# Patient Record
Sex: Female | Born: 1960 | Race: White | Hispanic: No | Marital: Single | Smoking: Never smoker
Health system: Southern US, Community
[De-identification: ages and names within clinical notes are randomized; demographics above are authoritative.]

## PROBLEM LIST (undated history)

## (undated) DIAGNOSIS — M1990 Unspecified osteoarthritis, unspecified site: Secondary | ICD-10-CM

## (undated) DIAGNOSIS — G4730 Sleep apnea, unspecified: Secondary | ICD-10-CM

## (undated) DIAGNOSIS — I10 Essential (primary) hypertension: Secondary | ICD-10-CM

## (undated) DIAGNOSIS — J449 Chronic obstructive pulmonary disease, unspecified: Secondary | ICD-10-CM

## (undated) DIAGNOSIS — I2510 Atherosclerotic heart disease of native coronary artery without angina pectoris: Secondary | ICD-10-CM

## (undated) DIAGNOSIS — I509 Heart failure, unspecified: Secondary | ICD-10-CM

## (undated) HISTORY — PX: FRACTURE SURGERY: SHX138

## (undated) HISTORY — PX: AORTIC VALVE REPLACEMENT (AVR)/CORONARY ARTERY BYPASS GRAFTING (CABG): SHX5725

## (undated) HISTORY — PX: ABDOMINAL SURGERY: SHX537

## (undated) HISTORY — PX: CORONARY ARTERY BYPASS GRAFT: SHX141

---

## 2018-04-03 ENCOUNTER — Other Ambulatory Visit: Payer: Self-pay

## 2018-04-03 ENCOUNTER — Emergency Department (HOSPITAL_COMMUNITY): Payer: Medicaid Other

## 2018-04-03 ENCOUNTER — Inpatient Hospital Stay (HOSPITAL_COMMUNITY)
Admission: EM | Admit: 2018-04-03 | Discharge: 2018-04-15 | DRG: 308 | Discharge status: Against Medical Advice | Payer: Medicaid Other | Attending: Internal Medicine | Admitting: Internal Medicine

## 2018-04-03 ENCOUNTER — Encounter (HOSPITAL_COMMUNITY): Payer: Self-pay

## 2018-04-03 DIAGNOSIS — Y901 Blood alcohol level of 20-39 mg/100 ml: Secondary | ICD-10-CM | POA: Diagnosis present

## 2018-04-03 DIAGNOSIS — K921 Melena: Secondary | ICD-10-CM | POA: Diagnosis present

## 2018-04-03 DIAGNOSIS — E440 Moderate protein-calorie malnutrition: Secondary | ICD-10-CM | POA: Diagnosis present

## 2018-04-03 DIAGNOSIS — I5043 Acute on chronic combined systolic (congestive) and diastolic (congestive) heart failure: Secondary | ICD-10-CM | POA: Diagnosis present

## 2018-04-03 DIAGNOSIS — R52 Pain, unspecified: Secondary | ICD-10-CM

## 2018-04-03 DIAGNOSIS — D500 Iron deficiency anemia secondary to blood loss (chronic): Secondary | ICD-10-CM | POA: Diagnosis present

## 2018-04-03 DIAGNOSIS — R112 Nausea with vomiting, unspecified: Secondary | ICD-10-CM

## 2018-04-03 DIAGNOSIS — I4891 Unspecified atrial fibrillation: Secondary | ICD-10-CM | POA: Diagnosis present

## 2018-04-03 DIAGNOSIS — R195 Other fecal abnormalities: Secondary | ICD-10-CM | POA: Diagnosis present

## 2018-04-03 DIAGNOSIS — Z952 Presence of prosthetic heart valve: Secondary | ICD-10-CM

## 2018-04-03 DIAGNOSIS — I272 Pulmonary hypertension, unspecified: Secondary | ICD-10-CM | POA: Diagnosis present

## 2018-04-03 DIAGNOSIS — Z9119 Patient's noncompliance with other medical treatment and regimen: Secondary | ICD-10-CM

## 2018-04-03 DIAGNOSIS — I110 Hypertensive heart disease with heart failure: Secondary | ICD-10-CM | POA: Diagnosis present

## 2018-04-03 DIAGNOSIS — Z9114 Patient's other noncompliance with medication regimen: Secondary | ICD-10-CM

## 2018-04-03 DIAGNOSIS — E8809 Other disorders of plasma-protein metabolism, not elsewhere classified: Secondary | ICD-10-CM | POA: Diagnosis not present

## 2018-04-03 DIAGNOSIS — M25571 Pain in right ankle and joints of right foot: Secondary | ICD-10-CM | POA: Diagnosis present

## 2018-04-03 DIAGNOSIS — F10220 Alcohol dependence with intoxication, uncomplicated: Secondary | ICD-10-CM

## 2018-04-03 DIAGNOSIS — I5082 Biventricular heart failure: Secondary | ICD-10-CM | POA: Diagnosis present

## 2018-04-03 DIAGNOSIS — J9811 Atelectasis: Secondary | ICD-10-CM | POA: Diagnosis present

## 2018-04-03 DIAGNOSIS — F1020 Alcohol dependence, uncomplicated: Secondary | ICD-10-CM | POA: Diagnosis not present

## 2018-04-03 DIAGNOSIS — R296 Repeated falls: Secondary | ICD-10-CM

## 2018-04-03 DIAGNOSIS — Z79899 Other long term (current) drug therapy: Secondary | ICD-10-CM

## 2018-04-03 DIAGNOSIS — E162 Hypoglycemia, unspecified: Secondary | ICD-10-CM | POA: Diagnosis not present

## 2018-04-03 DIAGNOSIS — F419 Anxiety disorder, unspecified: Secondary | ICD-10-CM | POA: Diagnosis present

## 2018-04-03 DIAGNOSIS — Z951 Presence of aortocoronary bypass graft: Secondary | ICD-10-CM

## 2018-04-03 DIAGNOSIS — E876 Hypokalemia: Secondary | ICD-10-CM | POA: Diagnosis not present

## 2018-04-03 DIAGNOSIS — Z7901 Long term (current) use of anticoagulants: Secondary | ICD-10-CM

## 2018-04-03 DIAGNOSIS — K449 Diaphragmatic hernia without obstruction or gangrene: Secondary | ICD-10-CM | POA: Diagnosis present

## 2018-04-03 DIAGNOSIS — E875 Hyperkalemia: Secondary | ICD-10-CM | POA: Diagnosis present

## 2018-04-03 DIAGNOSIS — K760 Fatty (change of) liver, not elsewhere classified: Secondary | ICD-10-CM | POA: Diagnosis present

## 2018-04-03 DIAGNOSIS — I482 Chronic atrial fibrillation: Principal | ICD-10-CM | POA: Diagnosis present

## 2018-04-03 DIAGNOSIS — R1033 Periumbilical pain: Secondary | ICD-10-CM | POA: Insufficient documentation

## 2018-04-03 DIAGNOSIS — E0781 Sick-euthyroid syndrome: Secondary | ICD-10-CM | POA: Diagnosis present

## 2018-04-03 DIAGNOSIS — Z9981 Dependence on supplemental oxygen: Secondary | ICD-10-CM

## 2018-04-03 DIAGNOSIS — Z7141 Alcohol abuse counseling and surveillance of alcoholic: Secondary | ICD-10-CM

## 2018-04-03 DIAGNOSIS — Y636 Underdosing and nonadministration of necessary drug, medicament or biological substance: Secondary | ICD-10-CM | POA: Diagnosis present

## 2018-04-03 DIAGNOSIS — R945 Abnormal results of liver function studies: Secondary | ICD-10-CM | POA: Diagnosis present

## 2018-04-03 DIAGNOSIS — J449 Chronic obstructive pulmonary disease, unspecified: Secondary | ICD-10-CM | POA: Diagnosis present

## 2018-04-03 DIAGNOSIS — Z9181 History of falling: Secondary | ICD-10-CM

## 2018-04-03 DIAGNOSIS — F418 Other specified anxiety disorders: Secondary | ICD-10-CM | POA: Diagnosis present

## 2018-04-03 DIAGNOSIS — I2510 Atherosclerotic heart disease of native coronary artery without angina pectoris: Secondary | ICD-10-CM | POA: Diagnosis present

## 2018-04-03 HISTORY — DX: Essential (primary) hypertension: I10

## 2018-04-03 HISTORY — DX: Heart failure, unspecified: I50.9

## 2018-04-03 HISTORY — DX: Unspecified osteoarthritis, unspecified site: M19.90

## 2018-04-03 HISTORY — DX: Chronic obstructive pulmonary disease, unspecified: J44.9

## 2018-04-03 HISTORY — DX: Sleep apnea, unspecified: G47.30

## 2018-04-03 HISTORY — DX: Atherosclerotic heart disease of native coronary artery without angina pectoris: I25.10

## 2018-04-03 LAB — URINALYSIS, ROUTINE W REFLEX MICROSCOPIC
BILIRUBIN URINE: NEGATIVE
Glucose, UA: NEGATIVE mg/dL
HGB URINE DIPSTICK: NEGATIVE
Ketones, ur: 20 mg/dL — AB
Leukocytes, UA: NEGATIVE
Nitrite: NEGATIVE
PROTEIN: NEGATIVE mg/dL
Specific Gravity, Urine: 1.033 — ABNORMAL HIGH (ref 1.005–1.030)
pH: 6 (ref 5.0–8.0)

## 2018-04-03 LAB — POC OCCULT BLOOD, ED: FECAL OCCULT BLD: POSITIVE — AB

## 2018-04-03 LAB — I-STAT VENOUS BLOOD GAS, ED
Bicarbonate: 26.2 mmol/L (ref 20.0–28.0)
O2 Saturation: 77 %
PCO2 VEN: 47.7 mmHg (ref 44.0–60.0)
PH VEN: 7.348 (ref 7.250–7.430)
PO2 VEN: 44 mmHg (ref 32.0–45.0)
TCO2: 28 mmol/L (ref 22–32)

## 2018-04-03 LAB — CBC WITH DIFFERENTIAL/PLATELET
ABS IMMATURE GRANULOCYTES: 0.1 10*3/uL (ref 0.0–0.1)
BASOS PCT: 1 %
Basophils Absolute: 0 10*3/uL (ref 0.0–0.1)
EOS PCT: 1 %
Eosinophils Absolute: 0 10*3/uL (ref 0.0–0.7)
HCT: 37.8 % (ref 36.0–46.0)
Hemoglobin: 12.3 g/dL (ref 12.0–15.0)
IMMATURE GRANULOCYTES: 1 %
LYMPHS ABS: 1.4 10*3/uL (ref 0.7–4.0)
LYMPHS PCT: 26 %
MCH: 31.2 pg (ref 26.0–34.0)
MCHC: 32.5 g/dL (ref 30.0–36.0)
MCV: 95.9 fL (ref 78.0–100.0)
MONO ABS: 0.6 10*3/uL (ref 0.1–1.0)
Monocytes Relative: 12 %
Neutro Abs: 3.3 10*3/uL (ref 1.7–7.7)
Neutrophils Relative %: 61 %
PLATELETS: 352 10*3/uL (ref 150–400)
RBC: 3.94 MIL/uL (ref 3.87–5.11)
RDW: 15.8 % — ABNORMAL HIGH (ref 11.5–15.5)
WBC: 5.5 10*3/uL (ref 4.0–10.5)

## 2018-04-03 LAB — COMPREHENSIVE METABOLIC PANEL
ALT: 52 U/L — ABNORMAL HIGH (ref 0–44)
AST: 167 U/L — AB (ref 15–41)
Albumin: 2.6 g/dL — ABNORMAL LOW (ref 3.5–5.0)
Alkaline Phosphatase: 143 U/L — ABNORMAL HIGH (ref 38–126)
Anion gap: 18 — ABNORMAL HIGH (ref 5–15)
BUN: 5 mg/dL — AB (ref 6–20)
CO2: 23 mmol/L (ref 22–32)
CREATININE: 0.72 mg/dL (ref 0.44–1.00)
Calcium: 7.7 mg/dL — ABNORMAL LOW (ref 8.9–10.3)
Chloride: 97 mmol/L — ABNORMAL LOW (ref 98–111)
GFR calc Af Amer: >60 mL/min (ref >60)
GFR calc non Af Amer: >60 mL/min (ref >60)
Glucose, Bld: 52 mg/dL — ABNORMAL LOW (ref 70–99)
Potassium: 3.6 mmol/L (ref 3.5–5.1)
SODIUM: 138 mmol/L (ref 135–145)
Total Bilirubin: 1.6 mg/dL — ABNORMAL HIGH (ref 0.3–1.2)
Total Protein: 5.3 g/dL — ABNORMAL LOW (ref 6.5–8.1)

## 2018-04-03 LAB — I-STAT TROPONIN, ED: Troponin i, poc: 0.02 ng/mL (ref 0.00–0.08)

## 2018-04-03 LAB — CBG MONITORING, ED
GLUCOSE-CAPILLARY: 45 mg/dL — AB (ref 70–99)
GLUCOSE-CAPILLARY: 105 mg/dL — AB (ref 70–99)

## 2018-04-03 LAB — RAPID URINE DRUG SCREEN, HOSP PERFORMED
AMPHETAMINES: NOT DETECTED
Barbiturates: NOT DETECTED
Benzodiazepines: NOT DETECTED
Cocaine: NOT DETECTED
OPIATES: POSITIVE — AB
Tetrahydrocannabinol: NOT DETECTED

## 2018-04-03 LAB — LIPASE, BLOOD: Lipase: 27 U/L (ref 11–51)

## 2018-04-03 LAB — I-STAT CG4 LACTIC ACID, ED
LACTIC ACID, VENOUS: 2.46 mmol/L — AB (ref 0.5–1.9)
LACTIC ACID, VENOUS: 1.63 mmol/L (ref 0.5–1.9)

## 2018-04-03 LAB — BRAIN NATRIURETIC PEPTIDE: B NATRIURETIC PEPTIDE 5: 466.4 pg/mL — AB (ref 0.0–100.0)

## 2018-04-03 LAB — ETHANOL: ALCOHOL ETHYL (B): 38 mg/dL — AB (ref <10)

## 2018-04-03 LAB — TROPONIN I: Troponin I: <0.03 ng/mL (ref <0.03)

## 2018-04-03 MED ORDER — LORAZEPAM 1 MG PO TABS
0.0000 mg | ORAL_TABLET | Freq: Four times a day (QID) | ORAL | Status: DC
  Administered 2018-04-03: 1 mg via ORAL
  Filled 2018-04-03: qty 1

## 2018-04-03 MED ORDER — LORAZEPAM 2 MG/ML IJ SOLN: 0.0000 mg | Freq: Four times a day (QID) | INTRAMUSCULAR | Status: DC

## 2018-04-03 MED ORDER — SODIUM CHLORIDE 0.9% FLUSH
3.0000 mL | Freq: Two times a day (BID) | INTRAVENOUS | Status: DC
  Administered 2018-04-04 (×2): 3 mL via INTRAVENOUS

## 2018-04-03 MED ORDER — THIAMINE HCL 100 MG/ML IJ SOLN
100.0000 mg | Freq: Once | INTRAMUSCULAR | Status: AC
  Administered 2018-04-03: 100 mg via INTRAVENOUS
  Filled 2018-04-03: qty 2

## 2018-04-03 MED ORDER — TRAZODONE HCL 100 MG PO TABS
100.0000 mg | ORAL_TABLET | Freq: Every day | ORAL | Status: DC
  Administered 2018-04-04 (×2): 100 mg via ORAL
  Filled 2018-04-03 (×2): qty 1

## 2018-04-03 MED ORDER — DEXTROSE 50 % IV SOLN
0.5000 | Freq: Once | INTRAVENOUS | Status: DC
  Administered 2018-04-03: 25 mL via INTRAVENOUS

## 2018-04-03 MED ORDER — VANCOMYCIN HCL 10 G IV SOLR
2000.0000 mg | Freq: Once | INTRAVENOUS | Status: AC
  Administered 2018-04-03: 2000 mg via INTRAVENOUS
  Filled 2018-04-03: qty 2000

## 2018-04-03 MED ORDER — NAPHAZOLINE-GLYCERIN 0.012-0.2 % OP SOLN: 2.0000 [drp] | Freq: Four times a day (QID) | OPHTHALMIC | Status: DC | PRN

## 2018-04-03 MED ORDER — ACETAMINOPHEN 650 MG RE SUPP: 650.0000 mg | Freq: Four times a day (QID) | RECTAL | Status: DC | PRN

## 2018-04-03 MED ORDER — SODIUM CHLORIDE 0.9% FLUSH
3.0000 mL | Freq: Two times a day (BID) | INTRAVENOUS | Status: DC
  Administered 2018-04-04 – 2018-04-14 (×17): 3 mL via INTRAVENOUS

## 2018-04-03 MED ORDER — DEXTROSE 50 % IV SOLN
25.0000 mL | Freq: Once | INTRAVENOUS | Status: AC
  Administered 2018-04-03: 25 mL via INTRAVENOUS

## 2018-04-03 MED ORDER — LORAZEPAM 2 MG/ML IJ SOLN: 0.0000 mg | Freq: Two times a day (BID) | INTRAMUSCULAR | Status: DC

## 2018-04-03 MED ORDER — SODIUM CHLORIDE 0.9 % IV SOLN
250.0000 mL | INTRAVENOUS | Status: DC | PRN
  Administered 2018-04-04: 250 mL via INTRAVENOUS

## 2018-04-03 MED ORDER — DULOXETINE HCL 60 MG PO CPEP
60.0000 mg | ORAL_CAPSULE | Freq: Every day | ORAL | Status: DC
  Administered 2018-04-04 – 2018-04-15 (×12): 60 mg via ORAL
  Filled 2018-04-03 (×12): qty 1

## 2018-04-03 MED ORDER — DILTIAZEM HCL-DEXTROSE 100-5 MG/100ML-% IV SOLN (PREMIX)
5.0000 mg/h | INTRAVENOUS | Status: DC
  Administered 2018-04-03 – 2018-04-04 (×2): 5 mg/h via INTRAVENOUS
  Administered 2018-04-05: 7.5 mg/h via INTRAVENOUS
  Filled 2018-04-03 (×4): qty 100

## 2018-04-03 MED ORDER — VITAMIN B-1 100 MG PO TABS
250.0000 mg | ORAL_TABLET | Freq: Every day | ORAL | Status: DC
  Administered 2018-04-04 – 2018-04-15 (×12): 250 mg via ORAL
  Filled 2018-04-03 (×13): qty 3

## 2018-04-03 MED ORDER — LORAZEPAM 2 MG/ML IJ SOLN: 1.0000 mg | Freq: Four times a day (QID) | INTRAMUSCULAR | Status: AC | PRN

## 2018-04-03 MED ORDER — HYDROCODONE-ACETAMINOPHEN 5-325 MG PO TABS
1.0000 | ORAL_TABLET | ORAL | Status: DC | PRN
  Administered 2018-04-04: 2 via ORAL
  Filled 2018-04-03: qty 2

## 2018-04-03 MED ORDER — METOPROLOL TARTRATE 5 MG/5ML IV SOLN
5.0000 mg | Freq: Once | INTRAVENOUS | Status: DC
  Administered 2018-04-03: 5 mg via INTRAVENOUS
  Filled 2018-04-03: qty 5

## 2018-04-03 MED ORDER — IOPAMIDOL (ISOVUE-300) INJECTION 61%
INTRAVENOUS | Status: AC
  Administered 2018-04-03: 100 mL
  Filled 2018-04-03: qty 100

## 2018-04-03 MED ORDER — PANTOPRAZOLE SODIUM 40 MG IV SOLR
40.0000 mg | Freq: Two times a day (BID) | INTRAVENOUS | Status: DC
  Administered 2018-04-03 – 2018-04-04 (×2): 40 mg via INTRAVENOUS
  Filled 2018-04-03 (×3): qty 40

## 2018-04-03 MED ORDER — VANCOMYCIN HCL 10 G IV SOLR
1250.0000 mg | Freq: Two times a day (BID) | INTRAVENOUS | Status: DC
  Filled 2018-04-03: qty 1250

## 2018-04-03 MED ORDER — DEXTROSE 50 % IV SOLN
0.5000 | Freq: Once | INTRAVENOUS | Status: AC
  Administered 2018-04-03: 25 mL via INTRAVENOUS
  Filled 2018-04-03: qty 50

## 2018-04-03 MED ORDER — LORAZEPAM 1 MG PO TABS
0.0000 mg | ORAL_TABLET | Freq: Four times a day (QID) | ORAL | Status: AC
  Administered 2018-04-04: 1 mg via ORAL
  Administered 2018-04-04 – 2018-04-05 (×3): 2 mg via ORAL
  Administered 2018-04-05: 1 mg via ORAL
  Filled 2018-04-03: qty 2
  Filled 2018-04-03 (×4): qty 1
  Filled 2018-04-03 (×2): qty 2

## 2018-04-03 MED ORDER — METOPROLOL SUCCINATE ER 100 MG PO TB24: 100.0000 mg | ORAL_TABLET | Freq: Every day | ORAL | Status: DC

## 2018-04-03 MED ORDER — ROFLUMILAST 500 MCG PO TABS
500.0000 ug | ORAL_TABLET | Freq: Every day | ORAL | Status: DC
  Administered 2018-04-04 – 2018-04-15 (×12): 500 ug via ORAL
  Filled 2018-04-03 (×12): qty 1

## 2018-04-03 MED ORDER — PIPERACILLIN-TAZOBACTAM 3.375 G IVPB: 3.3750 g | Freq: Three times a day (TID) | INTRAVENOUS | Status: DC

## 2018-04-03 MED ORDER — SENNOSIDES-DOCUSATE SODIUM 8.6-50 MG PO TABS: 1.0000 | ORAL_TABLET | Freq: Every evening | ORAL | Status: DC | PRN

## 2018-04-03 MED ORDER — ALBUTEROL SULFATE (2.5 MG/3ML) 0.083% IN NEBU: 2.5000 mg | INHALATION_SOLUTION | Freq: Four times a day (QID) | RESPIRATORY_TRACT | Status: DC | PRN

## 2018-04-03 MED ORDER — ONDANSETRON HCL 4 MG/2ML IJ SOLN: 4.0000 mg | Freq: Four times a day (QID) | INTRAMUSCULAR | Status: DC | PRN

## 2018-04-03 MED ORDER — LORAZEPAM 1 MG PO TABS
0.0000 mg | ORAL_TABLET | Freq: Two times a day (BID) | ORAL | Status: AC
  Administered 2018-04-06: 1 mg via ORAL
  Administered 2018-04-06: 2 mg via ORAL
  Administered 2018-04-07: 1 mg via ORAL
  Filled 2018-04-03 (×2): qty 1
  Filled 2018-04-03: qty 2

## 2018-04-03 MED ORDER — MAGNESIUM OXIDE 400 (241.3 MG) MG PO TABS
400.0000 mg | ORAL_TABLET | Freq: Every day | ORAL | Status: DC
  Administered 2018-04-04 – 2018-04-09 (×6): 400 mg via ORAL
  Filled 2018-04-03 (×6): qty 1

## 2018-04-03 MED ORDER — PIPERACILLIN-TAZOBACTAM 3.375 G IVPB 30 MIN
3.3750 g | Freq: Once | INTRAVENOUS | Status: AC
  Administered 2018-04-03: 3.375 g via INTRAVENOUS
  Filled 2018-04-03: qty 50

## 2018-04-03 MED ORDER — DEXTROSE 50 % IV SOLN
INTRAVENOUS | Status: AC
  Filled 2018-04-03: qty 50

## 2018-04-03 MED ORDER — DEXTROSE 10 % IV SOLN
INTRAVENOUS | Status: DC
  Administered 2018-04-04: 02:00:00 via INTRAVENOUS

## 2018-04-03 MED ORDER — FOLIC ACID 1 MG PO TABS
1.0000 mg | ORAL_TABLET | Freq: Once | ORAL | Status: AC
  Administered 2018-04-03: 1 mg via ORAL
  Filled 2018-04-03: qty 1

## 2018-04-03 MED ORDER — LORAZEPAM 1 MG PO TABS: 1.0000 mg | ORAL_TABLET | Freq: Four times a day (QID) | ORAL | Status: AC | PRN

## 2018-04-03 MED ORDER — LORAZEPAM 2 MG/ML IJ SOLN
1.0000 mg | Freq: Once | INTRAMUSCULAR | Status: AC
  Administered 2018-04-03: 1 mg via INTRAVENOUS
  Filled 2018-04-03: qty 1

## 2018-04-03 MED ORDER — GABAPENTIN 300 MG PO CAPS
600.0000 mg | ORAL_CAPSULE | Freq: Three times a day (TID) | ORAL | Status: DC
  Administered 2018-04-04 – 2018-04-15 (×35): 600 mg via ORAL
  Filled 2018-04-03 (×35): qty 2

## 2018-04-03 MED ORDER — LORAZEPAM 1 MG PO TABS: 0.0000 mg | ORAL_TABLET | Freq: Two times a day (BID) | ORAL | Status: DC

## 2018-04-03 MED ORDER — ACETAMINOPHEN 325 MG PO TABS
650.0000 mg | ORAL_TABLET | Freq: Four times a day (QID) | ORAL | Status: DC | PRN
  Administered 2018-04-05 – 2018-04-13 (×7): 650 mg via ORAL
  Filled 2018-04-03 (×8): qty 2

## 2018-04-03 MED ORDER — VITAMIN B-12 1000 MCG PO TABS
500.0000 ug | ORAL_TABLET | Freq: Every day | ORAL | Status: DC
  Administered 2018-04-04 – 2018-04-15 (×12): 500 ug via ORAL
  Filled 2018-04-03 (×12): qty 1

## 2018-04-03 MED ORDER — SODIUM CHLORIDE 0.9% FLUSH: 3.0000 mL | INTRAVENOUS | Status: DC | PRN

## 2018-04-03 MED ORDER — ONDANSETRON HCL 4 MG PO TABS: 4.0000 mg | ORAL_TABLET | Freq: Four times a day (QID) | ORAL | Status: DC | PRN

## 2018-04-03 MED ORDER — METOPROLOL TARTRATE 5 MG/5ML IV SOLN
5.0000 mg | Freq: Once | INTRAVENOUS | Status: AC
  Administered 2018-04-03: 5 mg via INTRAVENOUS
  Filled 2018-04-03: qty 5

## 2018-04-03 MED ORDER — ONDANSETRON HCL 4 MG/2ML IJ SOLN
4.0000 mg | Freq: Once | INTRAMUSCULAR | Status: AC
  Administered 2018-04-03: 4 mg via INTRAVENOUS
  Filled 2018-04-03: qty 2

## 2018-04-03 MED ORDER — MORPHINE SULFATE (PF) 4 MG/ML IV SOLN
4.0000 mg | Freq: Once | INTRAVENOUS | Status: AC
  Administered 2018-04-03: 4 mg via INTRAVENOUS
  Filled 2018-04-03: qty 1

## 2018-04-03 MED ORDER — FOLIC ACID 1 MG PO TABS
1.0000 mg | ORAL_TABLET | Freq: Every day | ORAL | Status: DC
  Administered 2018-04-04 – 2018-04-15 (×12): 1 mg via ORAL
  Filled 2018-04-03 (×12): qty 1

## 2018-04-03 MED ORDER — BUSPIRONE HCL 5 MG PO TABS
30.0000 mg | ORAL_TABLET | Freq: Two times a day (BID) | ORAL | Status: DC
  Administered 2018-04-04 – 2018-04-15 (×24): 30 mg via ORAL
  Filled 2018-04-03 (×26): qty 6

## 2018-04-03 NOTE — ED Provider Notes (Addendum)
[facility] EMERGENCY DEPARTMENT Provider Note   CSN: [id number] Arrival date & time: 04/03/18  1518     History   Chief Complaint Chief Complaint  Patient presents with  . Fatigue  . Atrial Fibrillation    HPI Laurie Lynch is a 57 y.o. female with a history of alcohol use disorder, Afib on Eliquis, COPD, CHF, CAD, periumbilical hernia s/p repair,and HTN who presents to the emergency department with a chief complaint of abdominal pain.  The patient endorses constant, worsening periumbilical pain and abdominal bloating for one week.  She reports associated nausea and black tarry stools for the last few days.  No history of similar.  She also reports bilateral lower extremity edema that has been gradually worsening over the last few days.  She also states that she is having all over pain and it feels as if her entire body is on fire.  She states that when the paramedics was attempting to place the IV in her left upper arm during transport that she was screaming in pain because she could barely stand any pressure to her skin. EMS reports she was given 5 mg of metoprolol and route as her EKG showed that she was in atrial fibrillation. She was also given 50 mcg of fentanyl for pain control.  She also reports that she has been feeling very fatigued for the last 3 days.  She has not had any of her home medications for the last 2 days because she was not feeling well.   She was most recently admitted at [facility] from 8/20-8/28 after she presented to the ED with N/V, pain all over, fatigue, weakness, and poor urine output. Her symptoms were thought to be secondary to acute ethanol intoxication that resolved with metabolism of EtOH and supportive care. She had no seizures or DTs during her admission.  They felt that her diffuse pain was not secondary to third spacing and swelling secondary to hypoalbuminemia as well as peripheral neuropathy secondary to her potential EtOH  abuse  She reports a history of alcohol use disorder.  She typically drinks 1/2 gallon of vodka per week.  Last drink was at 7 AM.  She denies a history of seizures from ETOH withdrawal.    The history is provided by the patient. No language interpreter was used.    Past Medical History:  Diagnosis Date  . Arthritis   . CHF (congestive heart failure) ([place])   . COPD (chronic obstructive pulmonary disease) ([place])   . Coronary artery disease   . Hypertension   . Sleep apnea     Patient Active Problem List   Diagnosis Date Noted  . COPD with acute exacerbation ([place]) 04/16/2018  . Occult GI bleeding 04/03/2018  . Atrial fibrillation with RVR ([place]) 04/03/2018  . Alcohol dependence with uncomplicated intoxication ([place]) 04/03/2018  . Hypoalbuminemia 04/03/2018  . Hypoglycemia without diagnosis of diabetes mellitus 04/03/2018  . Frequent falls 04/03/2018  . Periumbilical abdominal pain     Past Surgical History:  Procedure Laterality Date  . ABDOMINAL SURGERY    . 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  She reports CABG at [facility].  No records in [place]  . CARDIOVERSION N/A 04/14/2018   Procedure: CARDIOVERSION;  Surgeon: Larey Dresser, MD;  Location: [facility] ENDOSCOPY;  Service: Cardiovascular;  Laterality: N/A;  . CORONARY ARTERY BYPASS GRAFT    .  ESOPHAGOGASTRODUODENOSCOPY (EGD) WITH PROPOFOL N/A 04/12/2018   Procedure: ESOPHAGOGASTRODUODENOSCOPY (EGD) WITH PROPOFOL;  Surgeon: Clarene Essex, MD;  Location: [facility];  Service: Endoscopy;  Laterality: N/A;  . FRACTURE SURGERY    . TEE WITHOUT CARDIOVERSION N/A 04/14/2018   Procedure: TRANSESOPHAGEAL ECHOCARDIOGRAM (TEE);  Surgeon: Larey Dresser, MD;  Location: [facility] ENDOSCOPY;  Service: Cardiovascular;  Laterality: N/A;     OB History   None      Home Medications    Prior to Admission medications   Medication Sig Start Date  End Date Taking? Authorizing Provider  albuterol (PROVENTIL) (2.5 MG/3ML) 0.083% nebulizer solution Take 2.5 mg by nebulization 2 (two) times daily as needed for wheezing or shortness of breath.    Yes [provider]  albuterol (VENTOLIN HFA) 108 (90 Base) MCG/ACT inhaler Inhale 2 puffs into the lungs every 6 (six) hours as needed for wheezing or shortness of breath.   Yes [provider]  apixaban (ELIQUIS) 5 MG TABS tablet Take 5 mg by mouth 2 (two) times daily.   Yes [provider]  atorvastatin (LIPITOR) 40 MG tablet Take 40 mg by mouth at bedtime.   Yes [provider]  busPIRone (BUSPAR) 30 MG tablet Take 30 mg by mouth 2 (two) times daily.   Yes [provider]  DULoxetine (CYMBALTA) 30 MG capsule Take 60 mg by mouth daily.   Yes [provider]  folic acid (FOLVITE) 1 MG tablet Take 1 mg by mouth daily.   Yes [provider]  furosemide (LASIX) 40 MG tablet Take 40 mg by mouth daily.   Yes [provider]  gabapentin (NEURONTIN) 300 MG capsule Take 900 mg by mouth 2 (two) times daily.   Yes [provider]  Magnesium Oxide 500 MG TABS Take 500 mg by mouth daily.    Yes [provider]  nitroGLYCERIN (NITROSTAT) 0.4 MG SL tablet Place 0.4 mg under the tongue every 5 (five) minutes x 3 doses as needed for chest pain.   Yes [provider]  Omega-3 Fatty Acids (FISH OIL PO) Take 1 capsule by mouth daily.   Yes [provider]  roflumilast (DALIRESP) 500 MCG TABS tablet Take 500 mcg by mouth daily.   Yes [provider]  tetrahydrozoline-zinc (VISINE-AC) 0.05-0.25 % ophthalmic solution Place 2 drops into both eyes 3 (three) times daily as needed (for itching).   Yes [provider]  Thiamine HCl (VITAMIN B-1) 250 MG tablet Take 250 mg by mouth daily.   Yes [provider]  traZODone (DESYREL) 100 MG tablet Take 100 mg by mouth at bedtime.   Yes [provider]  vitamin B-12 (CYANOCOBALAMIN) 500 MCG tablet Take 500 mcg by mouth daily.   Yes [provider]  diclofenac sodium (VOLTAREN) 1 % GEL Apply 2 g topically 4 (four) times daily. Apply to right ankle for 7 days. 04/15/18   Arrien, Jimmy Picket, MD  diltiazem (CARDIZEM CD) 120 MG 24 hr capsule Take 1 capsule (120 mg total) by mouth daily. 04/15/18 05/15/18  Arrien, Jimmy Picket, MD  ibuprofen (ADVIL,MOTRIN) 400 MG tablet Take 1 tablet (400 mg total) by mouth every 8 (eight) hours as needed (as needed for right ankle pain.). 04/15/18   Arrien, Jimmy Picket, MD  metoprolol succinate (TOPROL-XL) 100 MG 24 hr tablet Take 1 tablet (100 mg total) by mouth 2 (two) times daily. Take with or immediately following a meal. 04/15/18 05/15/18  Arrien, Jimmy Picket, MD  pantoprazole ([place])  40 MG tablet Take 1 tablet (40 mg total) by mouth daily. 04/15/18 05/15/18  Arrien, Jimmy Picket, MD    Family History Family History  Problem Relation Age of Onset  . Lung cancer Mother   . Bone cancer Father   . Sudden Cardiac Death Neg Hx     Social History Social History   Tobacco Use  . Smoking status: Never Smoker  . Smokeless tobacco: Never Used  Substance Use Topics  . Alcohol use: Yes    Comment: 0.5 gallon week  . Drug use: Never     Allergies   Patient has no known allergies.   Review of Systems Review of Systems  Constitutional: Positive for chills, fatigue and fever. Negative for activity change.  Respiratory: Negative for shortness of breath.   Cardiovascular: Positive for leg swelling. Negative for chest pain and palpitations.  Gastrointestinal: Positive for abdominal distention, abdominal pain, blood in stool and nausea.  Genitourinary: Negative for dysuria.  Musculoskeletal: Positive for arthralgias and myalgias. Negative for back pain.  Skin: Negative for rash.  Allergic/Immunologic: Negative for immunocompromised state.  Neurological: Positive for  weakness (generalized). Negative for headaches.  Psychiatric/Behavioral: Negative for confusion.   Physical Exam Updated Vital Signs BP (!) 140/106   Pulse 80   Temp 98.1 F (36.7 C) (Oral)   Resp 18   Ht 5' 9"  (1.753 m)   Wt 80.4 kg   SpO2 96%   BMI 26.18 kg/m   Physical Exam  Constitutional: No distress.  Chronically ill appearing female   HENT:  Head: Normocephalic.  Eyes: Conjunctivae are normal.  Neck: Neck supple. No JVD present.  Cardiovascular: Normal rate, regular rhythm, normal heart sounds and intact distal pulses. Exam reveals no gallop and no friction rub.  No murmur heard. Pulmonary/Chest: Effort normal. No stridor. No respiratory distress. She has no wheezes. She has no rales.  Abdominal: Soft. She exhibits no distension and no mass. There is tenderness. There is guarding. There is no rebound. No hernia.  Obese abdomen.  Focal TTP in the periumbilical region.  She has some guarding, but no rebound.  No CVA tenderness bilaterally.  Remainder of the abdominal exam is unremarkable.  Genitourinary: Rectal exam shows external hemorrhoid and guaiac positive stool. Rectal exam shows no fissure and anal tone normal.  Genitourinary Comments: Chaperoned exam.  Soft brown stool noted on exam. One small external hemorrhoid that is non-thrombosed.   Musculoskeletal:  Diffusely tender to palpation throughout the bilateral upper and lower extremities with minimal palpation.  Mild diffuse edema throughout the bilateral upper and lower extremity edema.  No focal unilateral swelling or pain.   Neurological: She is alert.  Skin: Skin is warm. Capillary refill takes less than 2 seconds. No rash noted.  Psychiatric: Her behavior is normal.  Nursing note and vitals reviewed.    ED Treatments / Results  Labs (all labs ordered are listed, but only abnormal results are displayed) Labs Reviewed  COMPREHENSIVE METABOLIC PANEL - Abnormal; Notable for the following components:       Result Value   Chloride 97 (*)    Glucose, Bld 52 (*)    BUN 5 (*)    Calcium 7.7 (*)    Total Protein 5.3 (*)    Albumin 2.6 (*)    AST 167 (*)    ALT 52 (*)    Alkaline Phosphatase 143 (*)    Total Bilirubin 1.6 (*)    Anion gap 18 (*)    All other  components within normal limits  CBC WITH DIFFERENTIAL/PLATELET - Abnormal; Notable for the following components:   RDW 15.8 (*)    All other components within normal limits  BRAIN NATRIURETIC PEPTIDE - Abnormal; Notable for the following components:   B Natriuretic Peptide 466.4 (*)    All other components within normal limits  ETHANOL - Abnormal; Notable for the following components:   Alcohol, Ethyl (B) 38 (*)    All other components within normal limits  URINALYSIS, ROUTINE W REFLEX MICROSCOPIC - Abnormal; Notable for the following components:   Specific Gravity, Urine 1.033 (*)    Ketones, ur 20 (*)    All other components within normal limits  RAPID URINE DRUG SCREEN, HOSP PERFORMED - Abnormal; Notable for the following components:   Opiates POSITIVE (*)    All other components within normal limits  C-PEPTIDE - Abnormal; Notable for the following components:   C-Peptide 1.0 (*)    All other components within normal limits  BETA-HYDROXYBUTYRIC ACID - Abnormal; Notable for the following components:   Beta-Hydroxybutyric Acid 3.54 (*)    All other components within normal limits  TSH - Abnormal; Notable for the following components:   TSH 4.803 (*)    All other components within normal limits  COMPREHENSIVE METABOLIC PANEL - Abnormal; Notable for the following components:   Potassium 3.4 (*)    BUN 5 (*)    Calcium 7.5 (*)    Total Protein 4.7 (*)    Albumin 2.2 (*)    AST 106 (*)    Total Bilirubin 1.4 (*)    All other components within normal limits  CBC - Abnormal; Notable for the following components:   RBC 3.55 (*)    Hemoglobin 11.2 (*)    HCT 34.0 (*)    RDW 16.0 (*)    All other components within normal  limits  MAGNESIUM - Abnormal; Notable for the following components:   Magnesium 1.5 (*)    All other components within normal limits  GLUCOSE, CAPILLARY - Abnormal; Notable for the following components:   Glucose-Capillary 122 (*)    All other components within normal limits  GLUCOSE, CAPILLARY - Abnormal; Notable for the following components:   Glucose-Capillary 48 (*)    All other components within normal limits  COMPREHENSIVE METABOLIC PANEL - Abnormal; Notable for the following components:   Glucose, Bld 116 (*)    Calcium 8.0 (*)    Total Protein 4.8 (*)    Albumin 2.2 (*)    AST 109 (*)    All other components within normal limits  CBC - Abnormal; Notable for the following components:   RBC 3.45 (*)    Hemoglobin 10.7 (*)    HCT 33.9 (*)    RDW 16.8 (*)    All other components within normal limits  GLUCOSE, CAPILLARY - Abnormal; Notable for the following components:   Glucose-Capillary 112 (*)    All other components within normal limits  GLUCOSE, CAPILLARY - Abnormal; Notable for the following components:   Glucose-Capillary 127 (*)    All other components within normal limits  GLUCOSE, CAPILLARY - Abnormal; Notable for the following components:   Glucose-Capillary 102 (*)    All other components within normal limits  GLUCOSE, CAPILLARY - Abnormal; Notable for the following components:   Glucose-Capillary 115 (*)    All other components within normal limits  GLUCOSE, CAPILLARY - Abnormal; Notable for the following components:   Glucose-Capillary 101 (*)    All other  components within normal limits  CBC - Abnormal; Notable for the following components:   RBC 3.77 (*)    Hemoglobin 11.9 (*)    MCV 100.5 (*)    RDW 16.5 (*)    All other components within normal limits  COMPREHENSIVE METABOLIC PANEL - Abnormal; Notable for the following components:   Potassium 6.0 (*)    Calcium 8.4 (*)    Total Protein 4.9 (*)    Albumin 2.4 (*)    AST 327 (*)    ALT 69 (*)     Alkaline Phosphatase 136 (*)    Total Bilirubin 1.4 (*)    All other components within normal limits  OCCULT BLOOD X 1 CARD TO LAB, STOOL - Abnormal; Notable for the following components:   Fecal Occult Bld POSITIVE (*)    All other components within normal limits  COMPREHENSIVE METABOLIC PANEL - Abnormal; Notable for the following components:   Calcium 8.4 (*)    Total Protein 5.6 (*)    Albumin 2.5 (*)    AST 228 (*)    ALT 70 (*)    Alkaline Phosphatase 147 (*)    All other components within normal limits  MAGNESIUM - Abnormal; Notable for the following components:   Magnesium 1.3 (*)    All other components within normal limits  COMPREHENSIVE METABOLIC PANEL - Abnormal; Notable for the following components:   Potassium 3.4 (*)    Chloride 96 (*)    CO2 33 (*)    Calcium 8.4 (*)    Total Protein 5.4 (*)    Albumin 2.5 (*)    AST 131 (*)    ALT 50 (*)    Alkaline Phosphatase 134 (*)    All other components within normal limits  CBC - Abnormal; Notable for the following components:   RDW 16.2 (*)    All other components within normal limits  MAGNESIUM - Abnormal; Notable for the following components:   Magnesium 1.5 (*)    All other components within normal limits  COMPREHENSIVE METABOLIC PANEL - Abnormal; Notable for the following components:   Chloride 97 (*)    Calcium 8.2 (*)    Total Protein 5.4 (*)    Albumin 2.4 (*)    AST 86 (*)    Alkaline Phosphatase 130 (*)    All other components within normal limits  MAGNESIUM - Abnormal; Notable for the following components:   Magnesium 1.6 (*)    All other components within normal limits  GLUCOSE, CAPILLARY - Abnormal; Notable for the following components:   Glucose-Capillary 55 (*)    All other components within normal limits  GLUCOSE, CAPILLARY - Abnormal; Notable for the following components:   Glucose-Capillary 141 (*)    All other components within normal limits  BASIC METABOLIC PANEL - Abnormal; Notable for the  following components:   Chloride 97 (*)    Calcium 8.8 (*)    All other components within normal limits  GLUCOSE, CAPILLARY - Abnormal; Notable for the following components:   Glucose-Capillary 106 (*)    All other components within normal limits  GLUCOSE, CAPILLARY - Abnormal; Notable for the following components:   Glucose-Capillary 100 (*)    All other components within normal limits  BASIC METABOLIC PANEL - Abnormal; Notable for the following components:   Potassium 3.2 (*)    Calcium 8.6 (*)    All other components within normal limits  CBC - Abnormal; Notable for the following components:   RDW  16.1 (*)    All other components within normal limits  GLUCOSE, CAPILLARY - Abnormal; Notable for the following components:   Glucose-Capillary 108 (*)    All other components within normal limits  BASIC METABOLIC PANEL - Abnormal; Notable for the following components:   Potassium 5.2 (*)    CO2 21 (*)    Calcium 8.6 (*)    All other components within normal limits  GLUCOSE, CAPILLARY - Abnormal; Notable for the following components:   Glucose-Capillary 100 (*)    All other components within normal limits  BASIC METABOLIC PANEL - Abnormal; Notable for the following components:   Calcium 8.7 (*)    All other components within normal limits  PROTIME-INR - Abnormal; Notable for the following components:   Prothrombin Time 15.3 (*)    All other components within normal limits  GLUCOSE, CAPILLARY - Abnormal; Notable for the following components:   Glucose-Capillary 102 (*)    All other components within normal limits  GLUCOSE, CAPILLARY - Abnormal; Notable for the following components:   Glucose-Capillary 112 (*)    All other components within normal limits  GLUCOSE, CAPILLARY - Abnormal; Notable for the following components:   Glucose-Capillary 110 (*)    All other components within normal limits  BASIC METABOLIC PANEL - Abnormal; Notable for the following components:   Calcium  8.8 (*)    All other components within normal limits  GLUCOSE, CAPILLARY - Abnormal; Notable for the following components:   Glucose-Capillary 113 (*)    All other components within normal limits  GLUCOSE, CAPILLARY - Abnormal; Notable for the following components:   Glucose-Capillary 101 (*)    All other components within normal limits  I-STAT CG4 LACTIC ACID, ED - Abnormal; Notable for the following components:   Lactic Acid, Venous 2.46 (*)    All other components within normal limits  POC OCCULT BLOOD, ED - Abnormal; Notable for the following components:   Fecal Occult Bld POSITIVE (*)    All other components within normal limits  CBG MONITORING, ED - Abnormal; Notable for the following components:   Glucose-Capillary 45 (*)    All other components within normal limits  CBG MONITORING, ED - Abnormal; Notable for the following components:   Glucose-Capillary 105 (*)    All other components within normal limits  CULTURE, BLOOD (ROUTINE X 2)  MRSA PCR SCREENING  C DIFFICILE QUICK SCREEN W PCR REFLEX  LIPASE, BLOOD  TROPONIN I  PROINSULIN/INSULIN RATIO  SULFONYLUREA HYPOGLYCEMICS PANEL, SERUM  CORTISOL  HEMOGLOBIN A1C  HIV ANTIBODY (ROUTINE TESTING)  T4, FREE  GLUCOSE, CAPILLARY  GLUCOSE, CAPILLARY  GLUCOSE, CAPILLARY  GLUCOSE, CAPILLARY  MAGNESIUM  PHOSPHORUS  GLUCOSE, CAPILLARY  MAGNESIUM  GLUCOSE, CAPILLARY  GLUCOSE, CAPILLARY  GLUCOSE, CAPILLARY  GLUCOSE, CAPILLARY  GLUCOSE, CAPILLARY  GLUCOSE, CAPILLARY  GLUCOSE, CAPILLARY  GLUCOSE, CAPILLARY  GLUCOSE, CAPILLARY  GLUCOSE, CAPILLARY  GLUCOSE, CAPILLARY  GLUCOSE, CAPILLARY  GLUCOSE, CAPILLARY  GLUCOSE, CAPILLARY  GLUCOSE, CAPILLARY  GLUCOSE, CAPILLARY  GLUCOSE, CAPILLARY  MAGNESIUM  GLUCOSE, CAPILLARY  GLUCOSE, CAPILLARY  GLUCOSE, CAPILLARY  GLUCOSE, CAPILLARY  GLUCOSE, CAPILLARY  MAGNESIUM  GLUCOSE, CAPILLARY  GLUCOSE, CAPILLARY  GLUCOSE, CAPILLARY  GLUCOSE, CAPILLARY  GLUCOSE, CAPILLARY    BASIC METABOLIC PANEL  MAGNESIUM  GLUCOSE, CAPILLARY  GLUCOSE, CAPILLARY  GLUCOSE, CAPILLARY  GLUCOSE, CAPILLARY  GLUCOSE, CAPILLARY  MAGNESIUM  GLUCOSE, CAPILLARY  GLUCOSE, CAPILLARY  GLUCOSE, CAPILLARY  GLUCOSE, CAPILLARY  GLUCOSE, CAPILLARY  GLUCOSE, CAPILLARY  GLUCOSE, CAPILLARY  GLUCOSE, CAPILLARY  GLUCOSE,  CAPILLARY  GLUCOSE, CAPILLARY  GLUCOSE, CAPILLARY  I-STAT TROPONIN, ED  I-STAT CG4 LACTIC ACID, ED  I-STAT VENOUS BLOOD GAS, ED  TYPE AND SCREEN  ABO/RH    EKG EKG Interpretation  Date/Time:  Monday April 03 2018 15:51:13 EDT Ventricular Rate:  97 PR Interval:    QRS Duration: 99 QT Interval:  347 QTC Calculation: 441 R Axis:   56 Text Interpretation:  Atrial fibrillation Borderline repolarization abnormality No old tracing to compare Confirmed by Dorie Rank [phone]) on 04/03/2018 4:12:32 PM Also confirmed by Dorie Rank [phone]), editor Hattie Perch ([id number])  on 04/04/2018 7:40:29 AM   Radiology No results found.  Procedures .Critical Care Performed by: Joanne Gavel, PA-C Authorized by: Joanne Gavel, PA-C   Critical care provider statement:    Critical care time (minutes):  45   Critical care time was exclusive of:  Separately billable procedures and treating other patients and teaching time   Critical care was necessary to treat or prevent imminent or life-threatening deterioration of the following conditions:  Cardiac failure and metabolic crisis   Critical care was time spent personally by me on the following activities:  Ordering and performing treatments and interventions, ordering and review of laboratory studies, ordering and review of radiographic studies, pulse oximetry, re-evaluation of patient's condition, review of old charts, development of treatment plan with patient or surrogate, examination of patient and obtaining history from patient or surrogate   (including critical care time)  Medications Ordered in ED Medications   LORazepam (ATIVAN) tablet 1 mg (has no administration in time range)    Or  LORazepam (ATIVAN) injection 1 mg (has no administration in time range)  LORazepam (ATIVAN) tablet 0-4 mg (1 mg Oral Given 04/05/18 1758)    Followed by  LORazepam (ATIVAN) tablet 0-4 mg (1 mg Oral Given 04/07/18 1139)  morphine 4 MG/ML injection 4 mg (4 mg Intravenous Given 04/03/18 1640)  piperacillin-tazobactam (ZOSYN) IVPB 3.375 g (0 g Intravenous Stopped 04/03/18 1818)  vancomycin (VANCOCIN) 2,000 mg in sodium chloride 0.9 % 500 mL IVPB (0 mg Intravenous Stopped 04/03/18 2247)  ondansetron (ZOFRAN) injection 4 mg (4 mg Intravenous Given 04/03/18 1640)  dextrose 50 % solution 25 mL (25 mLs Intravenous Given 04/03/18 1826)  LORazepam (ATIVAN) injection 1 mg (1 mg Intravenous Given 04/03/18 1824)  iopamidol (ISOVUE-300) 61 % injection (100 mLs  Contrast Given 04/03/18 1927)  thiamine (B-1) injection 100 mg (100 mg Intravenous Given 03/03/98 3716)  folic acid (FOLVITE) tablet 1 mg (1 mg Oral Given 04/03/18 2035)  metoprolol tartrate (LOPRESSOR) injection 5 mg (5 mg Intravenous Given 04/03/18 2030)  dextrose 50 % solution 25 mL (25 mLs Intravenous Given 04/03/18 2055)  sodium chloride 0.9 % bolus 500 mL (0 mLs Intravenous Stopped 04/04/18 0430)  magnesium sulfate IVPB 2 g 50 mL (0 g Intravenous Stopped 04/04/18 1930)  potassium chloride SA (K-DUR,KLOR-CON) CR tablet 40 mEq (40 mEq Oral Given 04/04/18 1759)  digoxin (LANOXIN) 0.25 MG/ML injection 0.125 mg (0.125 mg Intravenous Given 04/05/18 2142)  oxyCODONE-acetaminophen (PERCOCET/ROXICET) 5-325 MG per tablet 2 tablet (2 tablets Oral Given 04/05/18 2024)  morphine 4 MG/ML injection 4 mg (4 mg Intravenous Given 04/06/18 0048)  diphenoxylate-atropine (LOMOTIL) 2.5-0.025 MG per tablet 2 tablet (2 tablets Oral Given 04/06/18 0047)  oxyCODONE-acetaminophen (PERCOCET/ROXICET) 5-325 MG per tablet 2 tablet (2 tablets Oral Given 04/06/18 0241)  furosemide (LASIX) injection 40 mg (40 mg Intravenous Given 04/06/18  0618)  sodium polystyrene (KAYEXALATE) 15 GM/60ML suspension 15 g (15 g  Oral Given 04/06/18 0910)  magnesium sulfate IVPB 2 g 50 mL (0 g Intravenous Stopped 04/07/18 1500)  LORazepam (ATIVAN) tablet 1 mg (1 mg Oral Given 04/07/18 1824)  magnesium sulfate IVPB 2 g 50 mL (0 g Intravenous Stopped 04/08/18 1510)  potassium chloride SA (K-DUR,KLOR-CON) CR tablet 40 mEq (40 mEq Oral Given 04/08/18 1405)  LORazepam (ATIVAN) tablet 1 mg (1 mg Oral Given 04/08/18 1834)  potassium chloride SA (K-DUR,KLOR-CON) CR tablet 40 mEq (40 mEq Oral Given 04/09/18 0942)  magnesium sulfate IVPB 2 g 50 mL ( Intravenous Paused 04/09/18 1010)  potassium chloride SA (K-DUR,KLOR-CON) CR tablet 40 mEq (40 mEq Oral Given 04/10/18 0837)  potassium chloride SA (K-DUR,KLOR-CON) CR tablet 40 mEq (40 mEq Oral Given 04/11/18 1224)  potassium chloride SA (K-DUR,KLOR-CON) CR tablet 40 mEq (40 mEq Oral Given 04/12/18 1524)  magnesium sulfate IVPB 2 g 50 mL (0 g Intravenous Stopped 04/13/18 0700)     Initial Impression / Assessment and Plan / ED Course  I have reviewed the triage vital signs and the nursing notes.  Pertinent labs & imaging results that were available during my care of the patient were reviewed by me and considered in my medical decision making (see chart for details).     57 year old female with a history of alcohol use disorder, Afib on Eliquis, COPD, CHF w/ preserved EF, CAD s/p CABG, periumbilical hernia s/p repair,and HTN presenting with periumbilical pain, nausea, vomiting, black tarry stools, generalized weakness, and fatigue.  The patient was seen and evaluated along with Dr. Tomi Bamberger, attending physician.  On arrival, the patient had no previous ED visits in the [facility] health system and her previous records were not available in [place].  No abnormal vital signs on arrival, but the patient was ill-appearing.   Initial lactate elevated at 2.46. Given her presenting symptoms there was concern for infectious etiology, she  was given an initial dose of vancomycin and Zosyn. Anion Gap 18.; pH found to be 7.348. Repeat lactate normal after 500cc fluid bolus given with vancomycin.   She has a long-standing history of chronic alcohol use.  Last drink was this morning at 7 AM.  She states that she drinks approximately half of a gallon of vodka on a weekly basis, but her medical records from at [facility] suggest she drinks closer to 1 gallon per week.  Initial CIWA score is 13.    On physical exam, she had focal periumbilical tenderness to palpation.  Given her exam with her history of periumbilical hernia, CT abdomen pelvis was ordered to assess for strangulated hernia.  CT exam was negative for acute intra-abdominal or pelvic findings.  She was noted to have severe hepatic steatosis.  Labs are notable for elevated transaminases and alk phos, which I suspect are chronic.  She also endorses black tarry stools for the last few days.  She has multiple risk factors for an upper GI bleed including chronic anticoagulation with Eliquis and her alcohol intake.  Hemoccult positive.  Soft brown stool was noted on rectal exam.  However, hemoglobin is normal today at 12.3.  Hypoglycemic at 52 on arrival. She was given half of an amp of D50. Repeat glucose 45 and she was given a full amp of D50 and improved to 105. She has no h/o of DM. Suspect hypoglycemia is secondary to alcohol use and remaining NPO while in the ED for her initial workup.   On re-evaluation, patient's cardiac rhythm remained in atrial fibrillation, but she  began to have RVR with rates in the 130s to 150s.  Rate improved to low 100s with 48m of metoprolol IV.  Cardizem initially avoided given critically low supplies.  Her rate improved for some time, but later elevated again to the 130s to 150s. Metoprolol repeated, and the hospitalist team was consulted for admission.  I question if there is a component of her elevated heart rate due in part to her withdrawal from  alcohol.  It is now been over 12 hours since her last drink.  Her ethanol level on arrival was 38.  See what the order set has been placed.  Thiamine and folate given in the ED.  Spoke with Dr. OMyna Hidalgowho will accept the patient for admission. The patient appears reasonably stabilized for admission considering the current resources, flow, and capabilities available in the ED at this time, and [facility] and/or treatment in the ED prior to admission.  Final Clinical Impressions(s) / ED Diagnoses   Final diagnoses:  Atrial fibrillation with RVR (HCC)  Hypoglycemia  Alcohol dependence with uncomplicated intoxication ([facility]    ED Discharge Orders         Ordered    diclofenac sodium (VOLTAREN) 1 % GEL  4 times daily     04/15/18 0917    pantoprazole (PROTONIX) 40 MG tablet  Daily     04/15/18 0917    metoprolol succinate (TOPROL-XL) 100 MG 24 hr tablet  2 times daily     04/15/18 0917    diltiazem (CARDIZEM CD) 120 MG 24 hr capsule  Daily     04/15/18 0917    ibuprofen (ADVIL,MOTRIN) 400 MG tablet  Every 8 hours PRN     04/15/18 0917    Increase activity slowly     04/15/18 0917    Diet - low sodium heart healthy     04/15/18 [id number]   Discharge instructions    Comments:  Please follow with primary care in 7 days.   04/15/18 0917           MJoanne Gavel PA-C 04/03/18 2253    KDorie Rank MD 04/05/18 2212    MJoline MaxcyA, PA-C 04/18/18 1757    KDorie Rank MD 04/19/18 [phone]

## 2018-04-03 NOTE — ED Notes (Signed)
Unable to get second set of cultures x 3 attempts. PA updated

## 2018-04-03 NOTE — ED Notes (Signed)
Gray tube sent with urine to lab

## 2018-04-03 NOTE — Progress Notes (Addendum)
Pharmacy Antibiotic Note  Laurie Lynch is a 57 y.o. female admitted on 04/03/2018 with sepsis.  Pharmacy has been consulted for vancomycin and Zosyn dosing.  Plan: Zosyn 3.375g IV over 30 min then 3.375g IV EI q8h Vancomycin 2000mg  IV x1 then 1250mg  IV q12h Monitor renal funx, cultures, LOT Vancomycin level as indicated  Height: 5\' 9"  (175.3 cm) Weight: 200 lb (90.7 kg) IBW/kg (Calculated) : 66.2  No data recorded.  No results for input(s): WBC, CREATININE, LATICACIDVEN, VANCOTROUGH, VANCOPEAK, VANCORANDOM, GENTTROUGH, GENTPEAK, GENTRANDOM, TOBRATROUGH, TOBRAPEAK, TOBRARND, AMIKACINPEAK, AMIKACINTROU, AMIKACIN in the last 168 hours.  CrCl cannot be calculated (No successful lab value found.).    Not on File  Antimicrobials this admission: Vancomycin 9/2 >>  Zosyn 9/2 >>   Dose adjustments this admission: none  Microbiology results: ordered  Thank you for allowing pharmacy to be a part of this patient's care.  Fredonia Highland, PharmD, BCPS Clinical Pharmacist [phone] Please check AMION for all [facility] numbers 04/03/2018

## 2018-04-03 NOTE — H&P (Signed)
History and Physical    Laurie Lynch NWG:[id number] DOB: 1960/09/17 DOA: 04/03/2018  PCP: Madison Hickman, MD   Patient coming from: Home   Chief Complaint: Abdominal pain, swelling, melena   HPI: Laurie Lynch is a 57 y.o. female with medical history significant for chronic atrial fibrillation on Eliquis, depression with anxiety, COPD, and alcoholism, now presenting to the emergency department for evaluation of periumbilical abdominal pain and melena.  Patient was admitted to a [facility] hospital late last month, discharged several days ago after treatment for acute kidney injury and alcohol-related problems; she was advised to stop Lasix and reduce diltiazem due to low BP's, and to stop Eliquis given recurrent falls and ongoing alcoholism.  Since returning home, she reports general malaise with poor appetite, tenderness in the periumbilical region, and black, tarry stool.  She reports that she has not been eating much of anything over this interval due to poor appetite and has not taken her medications.  She denies chest pain, shortness of breath, fevers, or chills.  She denies any vomiting.  Denies dysuria.  Despite the poor appetite and abdominal pain, she reports ongoing daily alcohol use.  She called EMS, was found to be in atrial fibrillation with rapid rate, given a dose of Lopressor and fentanyl, and brought into the ED.  ED Course: Upon arrival to the ED, patient is found to be afebrile, saturating adequately on room air, tachycardic in the 110s, and with stable blood pressure.  EKG features atrial fibrillation and chest x-ray is negative for acute cardia pulmonary disease.  CT of the abdomen and pelvis is negative for acute findings but notable for severe hepatic steatosis.  Chemistry panel is notable for glucose of 52 and mild elevation in alkaline phosphatase, transaminases, and bilirubin.  Ethanol level is 36.  CBC is unremarkable.  Troponin is undetectable.  DRE reveals brown stool that is  FOBT positive.  Blood cultures were collected, patient was given broad-spectrum empiric antibiotics, and she was treated with morphine, 2 IV pushes of Lopressor, and D50 x2.  While in the emergency department, heart rate has continued to rise, now in the 130s, she has had recurrent hypoglycemia, and has become tremulous concerning for alcohol withdrawal.  She will be admitted for ongoing evaluation and management of this.  Review of Systems:  All other systems reviewed and apart from HPI, are negative.  Past Medical History:  Diagnosis Date  . Arthritis   . CHF (congestive heart failure) (HCC)   . COPD (chronic obstructive pulmonary disease) (HCC)   . Coronary artery disease   . Hypertension     Past Surgical History:  Procedure Laterality Date  . ABDOMINAL SURGERY    . FRACTURE SURGERY       reports that she has never smoked. She has never used smokeless tobacco. She reports that she drinks alcohol. She reports that she does not use drugs.  No Known Allergies  Family History  Problem Relation Age of Onset  . Sudden Cardiac Death Neg Hx      Prior to Admission medications   Medication Sig Start Date End Date Taking? Authorizing Provider  albuterol (PROVENTIL) (2.5 MG/3ML) 0.083% nebulizer solution Take 2.5 mg by nebulization 2 (two) times daily as needed for wheezing or shortness of breath.    Yes [provider]  albuterol (VENTOLIN HFA) 108 (90 Base) MCG/ACT inhaler Inhale 2 puffs into the lungs every 6 (six) hours as needed for wheezing or shortness of breath.   Yes [provider]  apixaban (ELIQUIS) 5 MG TABS tablet Take 5 mg by mouth 2 (two) times daily.   Yes [provider]  atorvastatin (LIPITOR) 40 MG tablet Take 40 mg by mouth at bedtime.   Yes [provider]  busPIRone (BUSPAR) 30 MG tablet Take 30 mg by mouth 2 (two) times daily.   Yes [provider]  diltiazem (TIAZAC) 360 MG 24 hr capsule Take 360 mg by mouth daily.    Yes [provider]  DULoxetine (CYMBALTA) 30 MG capsule Take 60 mg by mouth daily.   Yes [provider]  folic acid (FOLVITE) 1 MG tablet Take 1 mg by mouth daily.   Yes [provider]  furosemide (LASIX) 40 MG tablet Take 40 mg by mouth daily.   Yes [provider]  gabapentin (NEURONTIN) 300 MG capsule Take 900 mg by mouth 2 (two) times daily.   Yes [provider]  Magnesium Oxide 500 MG TABS Take 500 mg by mouth daily.    Yes [provider]  metoprolol succinate (TOPROL-XL) 100 MG 24 hr tablet Take 100 mg by mouth daily. Take with or immediately following a meal.   Yes [provider]  nitroGLYCERIN (NITROSTAT) 0.4 MG SL tablet Place 0.4 mg under the tongue every 5 (five) minutes x 3 doses as needed for chest pain.   Yes [provider]  Omega-3 Fatty Acids (FISH OIL PO) Take 1 capsule by mouth daily.   Yes [provider]  roflumilast (DALIRESP) 500 MCG TABS tablet Take 500 mcg by mouth daily.   Yes [provider]  tetrahydrozoline-zinc (VISINE-AC) 0.05-0.25 % ophthalmic solution Place 2 drops into both eyes 3 (three) times daily as needed (for itching).   Yes [provider]  Thiamine HCl (VITAMIN B-1) 250 MG tablet Take 250 mg by mouth daily.   Yes [provider]  traZODone (DESYREL) 100 MG tablet Take 100 mg by mouth at bedtime.   Yes [provider]  vitamin B-12 (CYANOCOBALAMIN) 500 MCG tablet Take 500 mcg by mouth daily.   Yes [provider]    Physical Exam: Vitals:   04/03/18 1805 04/03/18 1815 04/03/18 2030 04/03/18 2108  BP:  (!) 151/79 132/75 132/75  Pulse: (!) 111 (!) 56 (!) 106 (!) 106  Resp:  17 15   Temp:      TempSrc:      SpO2:  95% 94%   Weight:      Height:          Constitutional: NAD, calm  Eyes: PERTLA, lids and conjunctivae normal ENMT: Mucous membranes are moist. Posterior pharynx clear of any exudate or lesions.   Neck:  normal, supple, no masses, no thyromegaly Respiratory: clear to auscultation bilaterally, no wheezing, no crackles. Normal respiratory effort.   Cardiovascular: Rate ~120 and irregular. Pretibial pitting edema bilaterally. Abdomen: No distension, soft, tender in mid-abdomen without rebound pain or guarding. Bowel sounds normal.  Musculoskeletal: no clubbing / cyanosis. No joint deformity upper and lower extremities.   Skin: no significant rashes, lesions, ulcers. Warm, dry, well-perfused. Neurologic: No facial asymmetry. Sensation intact. Strength 5/5 in all 4 limbs.  Psychiatric: Alert and oriented x 3. Calm, cooperative.     Labs on Admission: I have personally reviewed following labs and imaging studies  CBC: Recent Labs  Lab 04/03/18 1704  WBC 5.5  NEUTROABS 3.3  HGB 12.3  HCT 37.8  MCV 95.9  PLT 352   Basic Metabolic Panel: Recent Labs  Lab 04/03/18 1704  NA 138  K 3.6  CL 97*  CO2 23  GLUCOSE 52*  BUN 5*  CREATININE 0.72  CALCIUM 7.7*   GFR: Estimated Creatinine Clearance: 92 mL/min (by C-G formula based on SCr of 0.72 mg/dL). Liver Function Tests: Recent Labs  Lab 04/03/18 1704  AST 167*  ALT 52*  ALKPHOS 143*  BILITOT 1.6*  PROT 5.3*  ALBUMIN 2.6*   Recent Labs  Lab 04/03/18 1704  LIPASE 27   No results for input(s): AMMONIA in the last 168 hours. Coagulation Profile: No results for input(s): INR, PROTIME in the last 168 hours. Cardiac Enzymes: Recent Labs  Lab 04/03/18 1704  TROPONINI <0.03   BNP (last 3 results) No results for input(s): PROBNP in the last 8760 hours. HbA1C: No results for input(s): HGBA1C in the last 72 hours. CBG: Recent Labs  Lab 04/03/18 2034 04/03/18 2116  GLUCAP 45* 105*   Lipid Profile: No results for input(s): CHOL, HDL, LDLCALC, TRIG, CHOLHDL, LDLDIRECT in the last 72 hours. Thyroid Function Tests: No results for input(s): TSH, T4TOTAL, FREET4, T3FREE, THYROIDAB in the last 72 hours. Anemia Panel: No  results for input(s): VITAMINB12, FOLATE, FERRITIN, TIBC, IRON, RETICCTPCT in the last 72 hours. Urine analysis:    Component Value Date/Time   COLORURINE YELLOW 04/03/2018 2145   APPEARANCEUR CLEAR 04/03/2018 2145   LABSPEC 1.033 (H) 04/03/2018 2145   PHURINE 6.0 04/03/2018 2145   GLUCOSEU NEGATIVE 04/03/2018 2145   HGBUR NEGATIVE 04/03/2018 2145   BILIRUBINUR NEGATIVE 04/03/2018 2145   KETONESUR 20 (A) 04/03/2018 2145   PROTEINUR NEGATIVE 04/03/2018 2145   NITRITE NEGATIVE 04/03/2018 2145   LEUKOCYTESUR NEGATIVE 04/03/2018 2145   Sepsis Labs: @LABRCNTIP (procalcitonin:4,lacticidven:4) )No results found for this or any previous visit (from the past 240 hour(s)).   Radiological Exams on Admission: Dg Chest 2 View  Result Date: 04/03/2018 CLINICAL DATA:  Fatigue and atrial fibrillation. Shortness of breath. EXAM: CHEST - 2 VIEW COMPARISON:  None. FINDINGS: The heart size is borderline. The hila, mediastinum, lungs, and pleura are unremarkable. IMPRESSION: No active cardiopulmonary disease. Electronically Signed   By: Gerome Sam III M.D   On: 04/03/2018 18:02   Ct Abdomen Pelvis W Contrast  Result Date: 04/03/2018 CLINICAL DATA:  57 year old female with history of fatigue for the past 3 days. Black tarry stools. Peripheral edema. EXAM: CT ABDOMEN AND PELVIS WITH CONTRAST TECHNIQUE: Multidetector CT imaging of the abdomen and pelvis was performed using the standard protocol following bolus administration of intravenous contrast. CONTRAST:  ISOVUE-300 IOPAMIDOL (ISOVUE-300) INJECTION 61% COMPARISON:  No priors. FINDINGS: Lower chest: Cardiomegaly. Atherosclerotic calcifications in the descending thoracic aorta, left circumflex and right coronary arteries. Hepatobiliary: Diffuse low attenuation throughout the hepatic parenchyma, indicative of severe hepatic steatosis. No suspicious cystic or solid hepatic lesions. No intra or extrahepatic biliary ductal dilatation. Gallbladder is normal  in appearance. Pancreas: No pancreatic mass. No pancreatic ductal dilatation. No pancreatic or peripancreatic fluid or inflammatory changes. Spleen: Unremarkable. Adrenals/Urinary Tract: Bilateral kidneys and bilateral adrenal glands are normal in appearance. No hydroureteronephrosis. Urinary bladder is normal in appearance. Stomach/Bowel: Normal appearance of the stomach. No pathologic dilatation of small bowel or colon. Numerous colonic diverticulae are noted, without surrounding inflammatory changes to suggest an acute diverticulitis at this time. Normal appendix. Vascular/Lymphatic: Aortic atherosclerosis, without evidence of aneurysm or dissection in the abdominal or pelvic vasculature. No lymphadenopathy noted in the abdomen or pelvis. Reproductive: Uterus and ovaries are unremarkable in appearance. Other: No significant volume of  ascites.  No pneumoperitoneum. Musculoskeletal: There are no aggressive appearing lytic or blastic lesions noted in the visualized portions of the skeleton. IMPRESSION: 1. No acute findings are noted in the abdomen or pelvis to account for the patient's symptoms. 2. Severe hepatic steatosis. 3. Colonic diverticulosis without evidence of acute diverticulitis at this time. 4. Aortic atherosclerosis, in addition to least 2 vessel coronary artery disease. Please note that although the presence of coronary artery calcium documents the presence of coronary artery disease, the severity of this disease and any potential stenosis cannot be assessed on this non-gated CT examination. Assessment for potential risk factor modification, dietary therapy or pharmacologic therapy may be warranted, if clinically indicated. 5. Cardiomegaly. Electronically Signed   By: Trudie Reed M.D.   On: 04/03/2018 19:51    EKG: Independently reviewed. Atrial fibrillation.   Assessment/Plan  1. Atrial fibrillation with RVR  - Patient with chronic atrial fibrillation, not taking her medications for 2-3  days due to poor appetite  - CHADS-VASc might be only 2 (gender, CAD noted on CT)  - She is in atrial fibrillation with rate in 120's-130's at time of admission despite Lopressor with EMS and two Lopressor IVP's in ED  - Continue cardiac monitoring, start diltiazem infusion, convert back to oral dilt as tolerated, continue metoprolol  - Hold Eliquis in light of occult GIB, and consider stopping given alcoholism with recurrent falls    2. Hypoglycemia  - Serum glucose 52, treated with D50% and fell again to 45  - She is not treated for diabetes, had A1c of 5.5% in March 2019  - No evidence for infection  - Likely secondary to alcoholism  - Check cortisol, TSH, c-peptide, proinsulin/insulin, BHOB, and sulfonylurea panel  - Encourage her to eat, check CBG's, give glucose as needed   3. Alcoholism  - Patient acknowledges long hx of excessive daily drinking, not motivated to change habits  - Appears tremulous at time of admission  - Monitor with CIWA, use Ativan as needed, continue b-vitamins   4. Hypoalbuminemia  - Serum albumin is 2.6 on admission, similar to priors  - Likely secondary to alcoholism with liver disease and poor nutrition  - Dietary consultation requested    5. Occult GI bleeding  - Reports recent melena, noted to have brown stool on DRE that is FOBT positive  - H&H are normal on admission  - Hold Eliquis (consider stopping as above), type & screen, repeat CBC in am, start PPI    6. Depression with anxiety  - Continue Cymbalta, Buspar, and trazodone    7. Abdominal pain  - Pt complains of periumbilical abdominal pain  - Abd soft on exam, CT negative for acute findings  - May be chronic based on review of EMR  - Continue supportive care   8. COPD  - No SOB or wheezing on admission  - Continue Daliresp and prn albuterol    DVT prophylaxis: SCD's  Code Status: Full  Family Communication: Discussed with patient  Consults called: None Admission status:  Observation     Briscoe Deutscher, MD [facility] Pager [phone]  If 7PM-7AM, please contact night-coverage [email or web address] Password [facility]  04/03/2018, 11:04 PM

## 2018-04-03 NOTE — ED Triage Notes (Signed)
Per EMS pt has been fatigued for 3 days. Also having black tarry stools and peripheral edema. Pt has hx afib and is currently in afib. Pt on eliquis. Received 5mg  metoprolol en route and fentanyl,

## 2018-04-03 NOTE — ED Notes (Signed)
I Stat [place] Acid results of 2.46 reported to Dr. Roselyn Bering

## 2018-04-04 ENCOUNTER — Encounter (HOSPITAL_COMMUNITY): Payer: Self-pay | Admitting: Cardiology

## 2018-04-04 DIAGNOSIS — R945 Abnormal results of liver function studies: Secondary | ICD-10-CM | POA: Diagnosis present

## 2018-04-04 DIAGNOSIS — K921 Melena: Secondary | ICD-10-CM | POA: Diagnosis present

## 2018-04-04 DIAGNOSIS — E0781 Sick-euthyroid syndrome: Secondary | ICD-10-CM | POA: Diagnosis present

## 2018-04-04 DIAGNOSIS — F418 Other specified anxiety disorders: Secondary | ICD-10-CM | POA: Diagnosis present

## 2018-04-04 DIAGNOSIS — E876 Hypokalemia: Secondary | ICD-10-CM | POA: Diagnosis not present

## 2018-04-04 DIAGNOSIS — R52 Pain, unspecified: Secondary | ICD-10-CM | POA: Diagnosis not present

## 2018-04-04 DIAGNOSIS — I2510 Atherosclerotic heart disease of native coronary artery without angina pectoris: Secondary | ICD-10-CM | POA: Diagnosis present

## 2018-04-04 DIAGNOSIS — I5043 Acute on chronic combined systolic (congestive) and diastolic (congestive) heart failure: Secondary | ICD-10-CM | POA: Diagnosis present

## 2018-04-04 DIAGNOSIS — E8809 Other disorders of plasma-protein metabolism, not elsewhere classified: Secondary | ICD-10-CM | POA: Diagnosis not present

## 2018-04-04 DIAGNOSIS — K760 Fatty (change of) liver, not elsewhere classified: Secondary | ICD-10-CM | POA: Diagnosis present

## 2018-04-04 DIAGNOSIS — I517 Cardiomegaly: Secondary | ICD-10-CM | POA: Diagnosis not present

## 2018-04-04 DIAGNOSIS — F1020 Alcohol dependence, uncomplicated: Secondary | ICD-10-CM | POA: Diagnosis not present

## 2018-04-04 DIAGNOSIS — R195 Other fecal abnormalities: Secondary | ICD-10-CM | POA: Diagnosis not present

## 2018-04-04 DIAGNOSIS — R296 Repeated falls: Secondary | ICD-10-CM | POA: Diagnosis not present

## 2018-04-04 DIAGNOSIS — Y901 Blood alcohol level of 20-39 mg/100 ml: Secondary | ICD-10-CM | POA: Diagnosis present

## 2018-04-04 DIAGNOSIS — F10220 Alcohol dependence with intoxication, uncomplicated: Secondary | ICD-10-CM | POA: Diagnosis present

## 2018-04-04 DIAGNOSIS — J9811 Atelectasis: Secondary | ICD-10-CM | POA: Diagnosis present

## 2018-04-04 DIAGNOSIS — I5031 Acute diastolic (congestive) heart failure: Secondary | ICD-10-CM | POA: Diagnosis not present

## 2018-04-04 DIAGNOSIS — D500 Iron deficiency anemia secondary to blood loss (chronic): Secondary | ICD-10-CM | POA: Diagnosis present

## 2018-04-04 DIAGNOSIS — E162 Hypoglycemia, unspecified: Secondary | ICD-10-CM | POA: Diagnosis present

## 2018-04-04 DIAGNOSIS — M25571 Pain in right ankle and joints of right foot: Secondary | ICD-10-CM | POA: Diagnosis present

## 2018-04-04 DIAGNOSIS — K449 Diaphragmatic hernia without obstruction or gangrene: Secondary | ICD-10-CM | POA: Diagnosis present

## 2018-04-04 DIAGNOSIS — Z9181 History of falling: Secondary | ICD-10-CM | POA: Diagnosis not present

## 2018-04-04 DIAGNOSIS — Y636 Underdosing and nonadministration of necessary drug, medicament or biological substance: Secondary | ICD-10-CM | POA: Diagnosis present

## 2018-04-04 DIAGNOSIS — I4891 Unspecified atrial fibrillation: Secondary | ICD-10-CM

## 2018-04-04 DIAGNOSIS — F419 Anxiety disorder, unspecified: Secondary | ICD-10-CM | POA: Diagnosis present

## 2018-04-04 DIAGNOSIS — E875 Hyperkalemia: Secondary | ICD-10-CM | POA: Diagnosis present

## 2018-04-04 DIAGNOSIS — I110 Hypertensive heart disease with heart failure: Secondary | ICD-10-CM | POA: Diagnosis present

## 2018-04-04 DIAGNOSIS — E440 Moderate protein-calorie malnutrition: Secondary | ICD-10-CM | POA: Diagnosis present

## 2018-04-04 DIAGNOSIS — I5082 Biventricular heart failure: Secondary | ICD-10-CM | POA: Diagnosis present

## 2018-04-04 DIAGNOSIS — I482 Chronic atrial fibrillation: Secondary | ICD-10-CM | POA: Diagnosis present

## 2018-04-04 DIAGNOSIS — I272 Pulmonary hypertension, unspecified: Secondary | ICD-10-CM | POA: Diagnosis present

## 2018-04-04 DIAGNOSIS — J449 Chronic obstructive pulmonary disease, unspecified: Secondary | ICD-10-CM | POA: Diagnosis present

## 2018-04-04 LAB — GLUCOSE, CAPILLARY
GLUCOSE-CAPILLARY: 112 mg/dL — AB (ref 70–99)
GLUCOSE-CAPILLARY: 122 mg/dL — AB (ref 70–99)
GLUCOSE-CAPILLARY: 48 mg/dL — AB (ref 70–99)
GLUCOSE-CAPILLARY: 74 mg/dL (ref 70–99)
GLUCOSE-CAPILLARY: 81 mg/dL (ref 70–99)
Glucose-Capillary: 127 mg/dL — ABNORMAL HIGH (ref 70–99)
Glucose-Capillary: 86 mg/dL (ref 70–99)
Glucose-Capillary: 95 mg/dL (ref 70–99)

## 2018-04-04 LAB — COMPREHENSIVE METABOLIC PANEL
ALT: 41 U/L (ref 0–44)
AST: 106 U/L — ABNORMAL HIGH (ref 15–41)
Albumin: 2.2 g/dL — ABNORMAL LOW (ref 3.5–5.0)
Alkaline Phosphatase: 123 U/L (ref 38–126)
Anion gap: 8 (ref 5–15)
BUN: 5 mg/dL — ABNORMAL LOW (ref 6–20)
CHLORIDE: 100 mmol/L (ref 98–111)
CO2: 31 mmol/L (ref 22–32)
CREATININE: 0.75 mg/dL (ref 0.44–1.00)
Calcium: 7.5 mg/dL — ABNORMAL LOW (ref 8.9–10.3)
GFR calc non Af Amer: >60 mL/min (ref >60)
GLUCOSE: 82 mg/dL (ref 70–99)
POTASSIUM: 3.4 mmol/L — AB (ref 3.5–5.1)
Sodium: 139 mmol/L (ref 135–145)
Total Bilirubin: 1.4 mg/dL — ABNORMAL HIGH (ref 0.3–1.2)
Total Protein: 4.7 g/dL — ABNORMAL LOW (ref 6.5–8.1)

## 2018-04-04 LAB — ABO/RH: ABO/RH(D): A POS

## 2018-04-04 LAB — TSH: TSH: 4.803 u[IU]/mL — ABNORMAL HIGH (ref 0.350–4.500)

## 2018-04-04 LAB — CBC
HEMATOCRIT: 34 % — AB (ref 36.0–46.0)
Hemoglobin: 11.2 g/dL — ABNORMAL LOW (ref 12.0–15.0)
MCH: 31.5 pg (ref 26.0–34.0)
MCHC: 32.9 g/dL (ref 30.0–36.0)
MCV: 95.8 fL (ref 78.0–100.0)
PLATELETS: 338 10*3/uL (ref 150–400)
RBC: 3.55 MIL/uL — ABNORMAL LOW (ref 3.87–5.11)
RDW: 16 % — AB (ref 11.5–15.5)
WBC: 5.1 10*3/uL (ref 4.0–10.5)

## 2018-04-04 LAB — HEMOGLOBIN A1C
HEMOGLOBIN A1C: 5.2 % (ref 4.8–5.6)
MEAN PLASMA GLUCOSE: 102.54 mg/dL

## 2018-04-04 LAB — TYPE AND SCREEN
ABO/RH(D): A POS
ANTIBODY SCREEN: NEGATIVE

## 2018-04-04 LAB — CORTISOL: Cortisol, Plasma: 4.3 ug/dL

## 2018-04-04 LAB — HIV ANTIBODY (ROUTINE TESTING W REFLEX): HIV SCREEN 4TH GENERATION: NONREACTIVE

## 2018-04-04 LAB — T4, FREE: FREE T4: 1.22 ng/dL (ref 0.82–1.77)

## 2018-04-04 LAB — MRSA PCR SCREENING: MRSA by PCR: NEGATIVE

## 2018-04-04 LAB — BETA-HYDROXYBUTYRIC ACID: BETA-HYDROXYBUTYRIC ACID: 3.54 mmol/L — AB (ref 0.05–0.27)

## 2018-04-04 LAB — MAGNESIUM: Magnesium: 1.5 mg/dL — ABNORMAL LOW (ref 1.7–2.4)

## 2018-04-04 MED ORDER — ADULT MULTIVITAMIN W/MINERALS CH
1.0000 | ORAL_TABLET | Freq: Every day | ORAL | Status: DC
  Administered 2018-04-04 – 2018-04-15 (×12): 1 via ORAL
  Filled 2018-04-04 (×12): qty 1

## 2018-04-04 MED ORDER — POTASSIUM CHLORIDE 10 MEQ/100ML IV SOLN: 10.0000 meq | INTRAVENOUS | Status: DC

## 2018-04-04 MED ORDER — POTASSIUM CHLORIDE CRYS ER 20 MEQ PO TBCR
40.0000 meq | EXTENDED_RELEASE_TABLET | Freq: Once | ORAL | Status: AC
  Administered 2018-04-04: 40 meq via ORAL
  Filled 2018-04-04: qty 2

## 2018-04-04 MED ORDER — MAGNESIUM SULFATE 2 GM/50ML IV SOLN
2.0000 g | Freq: Once | INTRAVENOUS | Status: AC
  Administered 2018-04-04: 2 g via INTRAVENOUS
  Filled 2018-04-04: qty 50

## 2018-04-04 MED ORDER — PANTOPRAZOLE SODIUM 40 MG PO TBEC
40.0000 mg | DELAYED_RELEASE_TABLET | Freq: Two times a day (BID) | ORAL | Status: DC
  Administered 2018-04-04 – 2018-04-15 (×22): 40 mg via ORAL
  Filled 2018-04-04 (×22): qty 1

## 2018-04-04 MED ORDER — HYDROCODONE-ACETAMINOPHEN 5-325 MG PO TABS
1.0000 | ORAL_TABLET | ORAL | Status: DC | PRN
  Administered 2018-04-05: 1 via ORAL
  Filled 2018-04-04: qty 1

## 2018-04-04 MED ORDER — ENSURE ENLIVE PO LIQD
237.0000 mL | Freq: Two times a day (BID) | ORAL | Status: DC
  Administered 2018-04-04 – 2018-04-13 (×10): 237 mL via ORAL

## 2018-04-04 MED ORDER — SODIUM CHLORIDE 0.9 % IV BOLUS
500.0000 mL | Freq: Once | INTRAVENOUS | Status: AC
  Administered 2018-04-04: 500 mL via INTRAVENOUS

## 2018-04-04 MED ORDER — METOPROLOL TARTRATE 5 MG/5ML IV SOLN
5.0000 mg | Freq: Four times a day (QID) | INTRAVENOUS | Status: DC
  Administered 2018-04-04 – 2018-04-05 (×4): 5 mg via INTRAVENOUS
  Filled 2018-04-04 (×3): qty 5

## 2018-04-04 NOTE — Progress Notes (Signed)
[facility] TEAM 1 - Stepdown/ICU TEAM  Laurie Lynch  ZOX:[id number] DOB: [birth year] DOA: 04/03/2018 PCP: Madison Hickman, MD    Brief Narrative:  57 y.o. female with a hx of chronic atrial fibrillation on Eliquis, depression with anxiety, COPD, and alcoholism who presented to the ED w/ periumbilical abdominal pain and melena. Patient was admitted to a [facility] hospital late last month and discharged several days prior to this admit after treatment for acute kidney injury and alcohol-related complications. She was advised to stop Lasix and reduce diltiazem due to low BP's, and to stop Eliquis given recurrent falls and ongoing alcoholism. After returning home she noted general malaise with poor appetite, tenderness in the periumbilical region, and black tarry stools.  She admitted to ongoing daily alcohol use.  She called EMS, was found to be in atrial fibrillation with rapid rate, given a dose of Lopressor and fentanyl, and brought to the ED.  In the ED EKG featured atrial fibrillation w/ RVR. CT abd/pelvis was negative for acute findings but notable for severe hepatic steatosis.  CBC was unremarkable.  DRE revealed brown stool that was FOBT positive.    Significant Events: 9/2 admit   Subjective: Resting quietly in bed.  Easily awakens.  Denies acute complaints.  No resp distress.  No abdom pain at this time.    Assessment & Plan:  Chronic Atrial fibrillation with acute RVR  not taking her medications for 2-3 days - CHADS-VASc is 6 but she is too high risk for bleeding to anticoagulate - TSH is not low - rate controlling meds being titrated - Cardiology following   Hypomagnesemia Supplement to goal of 2.0 - suspect signif total body deficit due to alcoholism  Hypokalemia  Supplement to goal of 4.0 - suspect signif total body deficit due to alcoholism  Acute on Chronic combined systolic and diastolic heart failure  09/2017 TTE noted EF 45-50% - has LE edema (likely oncotic) but does not  appear to be total body volume overloaded   Hypoglycemia  No hx of DM - A1c 5.04 October 2017 - likely secondary to alcoholism - cortisol, TSH, c-peptide, proinsulin/insulin, BHOB, and sulfonylurea panel all pending per admit MD  Alcoholism w/ alcohol withdrawal  Patient acknowledged long hx of excessive daily drinking to admit MD and is not motivated to change habits - no florid withdrawal as of yet   Hypoalbuminemia  albumin 2.6 on admission - secondary to alcoholism with liver disease and poor nutrition   Occult GI bleeding  brown stool on DRE that is FOBT positive - monitor Hgb - no evidence of significant bleeding at this time   Depression with anxiety  Continue Cymbalta, Buspar, and trazodone    Abdominal pain  CT negative for acute findings - appears to be a chronic issue based on review of EMR   COPD  Well compensated at this time   Elevated TSH FT4 is normal - recheck in 8-12 weeks    DVT prophylaxis: SCDs Code Status: FULL CODE Family Communication: no family present at time of exam  Disposition Plan: SDU  Consultants:  Cardiology   Antimicrobials:  Zosyn 9/2  Vanc 9/2   Objective: Blood pressure 113/62, pulse (!) 105, temperature 98.5 F (36.9 C), temperature source Oral, resp. rate 13, height 5\' 9"  (1.753 m), weight 89.8 kg, SpO2 (!) 86 %.  Intake/Output Summary (Last 24 hours) at 04/04/2018 1557 Last data filed at 04/04/2018 1552 Gross per 24 hour  Intake 1608.06 ml  Output 850 ml  Net 758.06 ml   Filed Weights   04/03/18 1527 04/04/18 0000  Weight: 90.7 kg 89.8 kg    Examination: General: No acute respiratory distress Lungs: Clear to auscultation bilaterally without wheezes or crackles Cardiovascular: irreg irreg - no M or rub - rate ~110bpm Abdomen: Nondistended, soft, bowel sounds positive, no rebound, no appreciable mass Extremities: 1+ B LE edema   CBC: Recent Labs  Lab 04/03/18 1704 04/04/18 0650  WBC 5.5 5.1  NEUTROABS 3.3  --    HGB 12.3 11.2*  HCT 37.8 34.0*  MCV 95.9 95.8  PLT 352 338   Basic Metabolic Panel: Recent Labs  Lab 04/03/18 1704 04/04/18 0650  NA 138 139  K 3.6 3.4*  CL 97* 100  CO2 23 31  GLUCOSE 52* 82  BUN 5* 5*  CREATININE 0.72 0.75  CALCIUM 7.7* 7.5*  MG  --  1.5*   GFR: Estimated Creatinine Clearance: 91.5 mL/min (by C-G formula based on SCr of 0.75 mg/dL).  Liver Function Tests: Recent Labs  Lab 04/03/18 1704 04/04/18 0650  AST 167* 106*  ALT 52* 41  ALKPHOS 143* 123  BILITOT 1.6* 1.4*  PROT 5.3* 4.7*  ALBUMIN 2.6* 2.2*   Recent Labs  Lab 04/03/18 1704  LIPASE 27    Cardiac Enzymes: Recent Labs  Lab 04/03/18 1704  TROPONINI <0.03    HbA1C: Hgb A1c MFr Bld  Date/Time Value Ref Range Status  04/03/2018 11:49 PM 5.2 4.8 - 5.6 % Final    Comment:    (NOTE) Pre diabetes:          5.7%-6.4% Diabetes:              >6.4% Glycemic control for   <7.0% adults with diabetes     CBG: Recent Labs  Lab 04/03/18 2348 04/04/18 0050 04/04/18 0443 04/04/18 0805 04/04/18 1137  GLUCAP 48* 122* 74 81 86    Recent Results (from the past 240 hour(s))  Blood culture (routine x 2)     Status: None (Preliminary result)   Collection Time: 04/03/18  4:12 PM  Result Value Ref Range Status   Specimen Description BLOOD RIGHT ARM  Final   Special Requests   Final    BOTTLES DRAWN AEROBIC AND ANAEROBIC Blood Culture adequate volume   Culture   Final    NO GROWTH < 24 HOURS Performed at [facility], [address] [place], [place] [address]    Report Status PENDING  Incomplete  MRSA PCR Screening     Status: None   Collection Time: 04/03/18 11:59 PM  Result Value Ref Range Status   MRSA by PCR NEGATIVE NEGATIVE Final    Comment:        The GeneXpert MRSA Assay (FDA approved for NASAL specimens only), is one component of a comprehensive MRSA colonization surveillance program. It is not intended to diagnose MRSA infection nor to guide or monitor  treatment for MRSA infections. Performed at [facility], [address], [place], [place] [address]      Scheduled Meds: . busPIRone  30 mg Oral BID  . DULoxetine  60 mg Oral Daily  . feeding supplement (ENSURE ENLIVE)  237 mL Oral BID BM  . folic acid  1 mg Oral Daily  . gabapentin  600 mg Oral TID  . LORazepam  0-4 mg Oral Q6H   Followed by  . [START ON 04/06/2018] LORazepam  0-4 mg Oral Q12H  . magnesium oxide  400 mg  Oral Daily  . metoprolol tartrate  5 mg Intravenous Q6H  . multivitamin with minerals  1 tablet Oral Daily  . pantoprazole  40 mg Intravenous Q12H  . roflumilast  500 mcg Oral Daily  . sodium chloride flush  3 mL Intravenous Q12H  . sodium chloride flush  3 mL Intravenous Q12H  . vitamin B-1  250 mg Oral Daily  . traZODone  100 mg Oral QHS  . vitamin B-12  500 mcg Oral Daily   Continuous Infusions: . sodium chloride 10 mL/hr at 04/04/18 1500  . diltiazem (CARDIZEM) infusion 5 mg/hr (04/04/18 1500)     LOS: 0 days   Lonia Blood, MD [facility] Office  [phone] Pager - Text Page per Amion  If 7PM-7AM, please contact night-coverage per Amion 04/04/2018, 3:57 PM

## 2018-04-04 NOTE — Progress Notes (Signed)
Follow-up:  Pt's HR has continued to trend up (120-130's, 150-170's w/ activity). BP's are soft. Unable to titrate Cardizem drip any higher d/t soft BP's despite IVF bolus. Pt remains asymptomatic and is currently sleeping. Discussed pt w/ L. Su Hilt. NP w/ cardiology service who has agreed to see pt this am. Will continue to monitor closely on telemetry.   Natalia Leatherwood. Valeda Malm, NP [facility] Pager [phone]

## 2018-04-04 NOTE — Progress Notes (Signed)
Initial Nutrition Assessment  DOCUMENTATION CODES:   Not applicable  INTERVENTION:    Ensure Enlive po BID, each supplement provides 350 kcal and 20 grams of protein  Multivitamin with minerals daily  NUTRITION DIAGNOSIS:   Inadequate oral intake related to (abdominal pain related to acute illness) as evidenced by meal completion < 50%.  GOAL:   Patient will meet greater than or equal to 90% of their needs  MONITOR:   PO intake, Supplement acceptance, Labs  REASON FOR ASSESSMENT:   Consult Assessment of nutrition requirement/status  ASSESSMENT:   57 yo female with PMH of CHF, COPD, CAD, and alcoholism who was admitted on 9/2 with melena and abdominal pain.   Cardiology evaluating patient for A fib with RVR.   Patient sleeping upon RD entering her room. She was difficult to keep awake, so did not obtain much nutrition hx. She thinks she usually weighs around 130 lbs and thinks she has been eating well. Per review of flow sheets, intake of meals has been 30-50%. Per RN, patient ate a little better at lunch today, than breakfast.   Low albumin likely related to chronic liver failure associated with ETOH abuse and volume overload.  Labs reviewed. Potassium 3.4 (L), BUN 5 (L), magnesium 1.5 (L) CBG's: 122-74-81-86 Medications reviewed and include Folic acid, Mag-ox, Thiamine, vitamin B-12.  Weight likely above usual weight due to significant fluid retention/edema.   NUTRITION - FOCUSED PHYSICAL EXAM:    Most Recent Value  Orbital Region  No depletion  Upper Arm Region  No depletion  Thoracic and Lumbar Region  No depletion  Buccal Region  No depletion  Temple Region  No depletion  Clavicle Bone Region  No depletion  Clavicle and Acromion Bone Region  No depletion  Scapular Bone Region  No depletion  Dorsal Hand  No depletion  Patellar Region  No depletion  Anterior Thigh Region  No depletion  Posterior Calf Region  No depletion  Edema (RD Assessment)  Severe   Hair  Reviewed  Eyes  Reviewed  Mouth  Reviewed  Skin  Reviewed  Nails  Reviewed       Diet Order:   Diet Order            Diet Heart Room service appropriate? Yes; Fluid consistency: Thin  Diet effective now              EDUCATION NEEDS:   No education needs have been identified at this time  Skin:  Skin Assessment: Reviewed RN Assessment  Last BM:  9/2  Height:   Ht Readings from Last 1 Encounters:  04/04/18 5\' 9"  (1.753 m)    Weight:   Wt Readings from Last 1 Encounters:  04/04/18 89.8 kg    Ideal Body Weight:  65.9 kg  BMI:  Body mass index is 29.22 kg/m.  Estimated Nutritional Needs:   Kcal:  1700-1900  Protein:  75-90 gm  Fluid:  >/= 1.8 L    Joaquin Courts, RD, LDN, CNSC Pager [phone] After Hours Pager [phone]

## 2018-04-04 NOTE — Consult Note (Signed)
Cardiology Consult    Patient ID: Vanette Noguchi MRN: [id number], DOB/AGE: [birth year]   Admit date: 04/03/2018 Date of Consult: 04/04/2018  Primary Physician: Madison Hickman, MD Primary Cardiologist: New Requesting Provider: Lelon Perla, NP  Patient Profile    Takya Vandivier is a 57 y.o. female with a history of chronic atrial fibrillation on Eliquis, noncompliance, alcoholism on CIWA protocol, ?HFrEF (09/2017 EF 45-50%), frequent falls, COPD (prescribed home oxygen), depression and anxiety who is being seen today for the evaluation of atrial fibriallation with RVR at the request of Tama Gander, NP.  Past Medical History   Past Medical History:  Diagnosis Date  . Arthritis   . CHF (congestive heart failure) (HCC)   . COPD (chronic obstructive pulmonary disease) (HCC)   . Coronary artery disease   . Hypertension     Past Surgical History:  Procedure Laterality Date  . ABDOMINAL SURGERY    . 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  She reports CABG at [facility].  No records in Care Everywhere  . CORONARY ARTERY BYPASS GRAFT    . FRACTURE SURGERY       Allergies  No Known Allergies  History of Present Illness    Patient with PMH as above and h/o hospital admissions and reportedly four previous admissions for alcohol abuse and subsequent exacerbation of Afib with RVR and COPD (also noncompliant on home O2). Patient's CareEverywhere documentation states that she has repeatedly stated she does not have a desire to quit drinking. She reportedly lives in an apartment alone and refuses ALF placement. She consumes 1/5 liquor daily despite multiple IP/OP rehab programs. Her last documented hospital admission was late August 2019 for treatment for AKI and alcoholism. At that time, she was advised to stop Lasix and reduce her dose of diltiazem due to low BP. She was also instructed to stop  Eliquis given her h/o recurrent falls and ongoing alcoholism.   Since discharge on 8/28, patient reports she has felt general malaise, poor appetite, tender periumbilical region, and melena. She has not taken her medications. She denies CP, SOB, n/v/d, fevers, or chills.   On 04/03/2018, she reportedly called EMS and was found to be in Afib with RVR. EMS administered Lopressor and fentanyl and brought her to the ED. In the ED, she found to be afebrile, saturating adequately on room air, tachycardic in the 110s, and with BP initially elevated and into the 150s systolic but stabilized later in the 130s/70s.  EKG showed atrial fibrillation and chest x-ray was negative for acute cardiopulmonary disease.  CT of the abdomen and pelvis was negative for acute findings but notable for severe hepatic steatosis.  Chemistry panel was notable for glucose of 52 and mild elevation in alkaline phosphatase, transaminases, and bilirubin.  Ethanol level was 36.  CBC was unremarkable.  Troponin negative.  DRE was FOBT positive.  Blood cultures were collected, and patient was given broad-spectrum empiric antibiotics, treated with morphine, 2 IV pushes of Lopressor, and D50 x2.  While in the emergency department, heart rate continued to rise into the 130s and patient became tremulous concerning for alcohol withdrawal.  She was admitted for ongoing evaluation and management of this.  Inpatient Medications    . busPIRone  30 mg Oral BID  . DULoxetine  60 mg Oral Daily  . folic acid  1 mg Oral Daily  . gabapentin  600 mg Oral TID  .  LORazepam  0-4 mg Oral Q6H   Followed by  . [START ON 04/06/2018] LORazepam  0-4 mg Oral Q12H  . magnesium oxide  400 mg Oral Daily  . metoprolol tartrate  5 mg Intravenous Q6H  . pantoprazole  40 mg Intravenous Q12H  . roflumilast  500 mcg Oral Daily  . sodium chloride flush  3 mL Intravenous Q12H  . sodium chloride flush  3 mL Intravenous Q12H  . vitamin B-1  250 mg Oral Daily  . traZODone   100 mg Oral QHS  . vitamin B-12  500 mcg Oral Daily    Family History    Family History  Problem Relation Age of Onset  . Sudden Cardiac Death Neg Hx    She indicated that the status of her neg hx is unknown.   Social History    Social History   Socioeconomic History  . Marital status: Single    Spouse name: Not on file  . Number of children: Not on file  . Years of education: Not on file  . Highest education level: Not on file  Occupational History  . Not on file  Social Needs  . Financial resource strain: Not on file  . Food insecurity:    Worry: Not on file    Inability: Not on file  . Transportation needs:    Medical: Not on file    Non-medical: Not on file  Tobacco Use  . Smoking status: Never Smoker  . Smokeless tobacco: Never Used  Substance and Sexual Activity  . Alcohol use: Yes    Comment: 0.5 gallon week  . Drug use: Never  . Sexual activity: Not Currently  Lifestyle  . Physical activity:    Days per week: Not on file    Minutes per session: Not on file  . Stress: Not on file  Relationships  . Social connections:    Talks on phone: Not on file    Gets together: Not on file    Attends religious service: Not on file    Active member of club or organization: Not on file    Attends meetings of clubs or organizations: Not on file    Relationship status: Not on file  . Intimate partner violence:    Fear of current or ex partner: Not on file    Emotionally abused: Not on file    Physically abused: Not on file    Forced sexual activity: Not on file  Other Topics Concern  . Not on file  Social History Narrative  . Not on file     Review of Systems    As below and in HPI  General:  No chills, fever, night sweats ++weight changes. ++fatigue Cardiovascular:  No chest pain, dyspnea on exertion, ++edema, no orthopnea, palpitations, paroxysmal nocturnal dyspnea. Dermatological: No rash, lesions/masses Respiratory: No cough, dyspnea Urologic: No  hematuria, dysuria Abdominal:   No nausea, vomiting, diarrhea, bright red blood per rectum, ++melena, no hematemesis ++abdominal pain Neurologic:  No visual changes, wkns, changes in mental status. All other systems reviewed and are otherwise negative except as noted above.  Physical Exam    Blood pressure 113/62, pulse (!) 105, temperature 98.5 F (36.9 C), temperature source Oral, resp. rate 13, height 5\' 9"  (1.753 m), weight 89.8 kg, SpO2 (!) 86 %.  General: Somnolent Psych: Normal affect. Neuro: Somnolent. Moves all extremities spontaneously. HEENT: Normal  Neck: Supple without bruits Lungs:  Resp regular and unlabored, CTA. Heart: IRIR no s3, s4,  or murmurs. Abdomen: soft, tender, distended, BS + x 4.  Extremities: No clubbing, cyanosis. 3+ edema. DP/PT/Radials 2+ and equal bilaterally.  Labs    Troponin [facility] of Care Test) Recent Labs    04/03/18 1704  TROPIPOC 0.02   Recent Labs    04/03/18 1704  TROPONINI <0.03   Lab Results  Component Value Date   WBC 5.1 04/04/2018   HGB 11.2 (L) 04/04/2018   HCT 34.0 (L) 04/04/2018   MCV 95.8 04/04/2018   PLT 338 04/04/2018    Recent Labs  Lab 04/04/18 0650  NA 139  K 3.4*  CL 100  CO2 31  BUN 5*  CREATININE 0.75  CALCIUM 7.5*  PROT 4.7*  BILITOT 1.4*  ALKPHOS 123  ALT 41  AST 106*  GLUCOSE 82   No results found for: CHOL, HDL, LDLCALC, TRIG No results found for: [facility]   Radiology Studies    Dg Chest 2 View  Result Date: 04/03/2018 CLINICAL DATA:  Fatigue and atrial fibrillation. Shortness of breath. EXAM: CHEST - 2 VIEW COMPARISON:  None. FINDINGS: The heart size is borderline. The hila, mediastinum, lungs, and pleura are unremarkable. IMPRESSION: No active cardiopulmonary disease. Electronically Signed   By: Gerome Sam III M.D   On: 04/03/2018 18:02   Ct Abdomen Pelvis W Contrast  Result Date: 04/03/2018 CLINICAL DATA:  57 year old female with history of fatigue for the past 3 days. Black tarry  stools. Peripheral edema. EXAM: CT ABDOMEN AND PELVIS WITH CONTRAST TECHNIQUE: Multidetector CT imaging of the abdomen and pelvis was performed using the standard protocol following bolus administration of intravenous contrast. CONTRAST:  ISOVUE-300 IOPAMIDOL (ISOVUE-300) INJECTION 61% COMPARISON:  No priors. FINDINGS: Lower chest: Cardiomegaly. Atherosclerotic calcifications in the descending thoracic aorta, left circumflex and right coronary arteries. Hepatobiliary: Diffuse low attenuation throughout the hepatic parenchyma, indicative of severe hepatic steatosis. No suspicious cystic or solid hepatic lesions. No intra or extrahepatic biliary ductal dilatation. Gallbladder is normal in appearance. Pancreas: No pancreatic mass. No pancreatic ductal dilatation. No pancreatic or peripancreatic fluid or inflammatory changes. Spleen: Unremarkable. Adrenals/Urinary Tract: Bilateral kidneys and bilateral adrenal glands are normal in appearance. No hydroureteronephrosis. Urinary bladder is normal in appearance. Stomach/Bowel: Normal appearance of the stomach. No pathologic dilatation of small bowel or colon. Numerous colonic diverticulae are noted, without surrounding inflammatory changes to suggest an acute diverticulitis at this time. Normal appendix. Vascular/Lymphatic: Aortic atherosclerosis, without evidence of aneurysm or dissection in the abdominal or pelvic vasculature. No lymphadenopathy noted in the abdomen or pelvis. Reproductive: Uterus and ovaries are unremarkable in appearance. Other: No significant volume of ascites.  No pneumoperitoneum. Musculoskeletal: There are no aggressive appearing lytic or blastic lesions noted in the visualized portions of the skeleton. IMPRESSION: 1. No acute findings are noted in the abdomen or pelvis to account for the patient's symptoms. 2. Severe hepatic steatosis. 3. Colonic diverticulosis without evidence of acute diverticulitis at this time. 4. Aortic atherosclerosis,  in addition to least 2 vessel coronary artery disease. Please note that although the presence of coronary artery calcium documents the presence of coronary artery disease, the severity of this disease and any potential stenosis cannot be assessed on this non-gated CT examination. Assessment for potential risk factor modification, dietary therapy or pharmacologic therapy may be warranted, if clinically indicated. 5. Cardiomegaly. Electronically Signed   By: Trudie Reed M.D.   On: 04/03/2018 19:51    ECG & Cardiac Imaging    TTE 10/03/2017 There is normal left ventricular wall thickness.  The left ventricular ejection fraction is mildly reduced (45-50%). Septal motion is consistent with conduction abnormality. The aortic valve is trileaflet. Unable to adequately determine diastolic dysfunction. The mitral valve leaflets appear thickened, but open well. There is mild-moderate (1-2+) mitral regurgitation. There is moderate (2+) tricuspid regurgitation. Right ventricular systolic pressure is elevated between 50-3mm Hg, consistent with moderately severe pulmonary hypertension. The left atrium is moderately dilated. The right atrium is mildly dilated.  Assessment & Plan    Chronic Afib with RVR in setting of alcohol abuse and withdrawal - Likely exacerbated ventricular rates d/t alcohol withdrawal - CHA2DS2VASc score of at least (CHF x1, HTNx1, vascular / carotid disease x1, female x1)=4 - Previously on Eliquis, stopped d/t h/o falls and alcoholism. - HR 56-170s with BP 80-150s/ 50s-80s - K 3.4, Mg 1.5  Replete potassium with goal 4.0. Mg goal 2.0. - Renal function 0.72  0.75 - Home medications include eliquis 5mg  tabs, atorvastatin 40mg , diltiazem 360mg  reduced to 240, lasix 40mg , toprol 200mg , and nitro SL - Currently on toprol xl 100mg  qd po, diltiazem drip 5mg /hr - HR continues to elevate with ambulation  Acute on Chronic combined systolic and diastolic heart failure  - 09/2017 Echo  above with reduced EF 45-50% - LEE, consider IV diuresis once BP and HR stable - Conservative IVF  Hypertension - Currently stable - Continue to monitor   Alcohol Abuse -On CIWA protocol -Likely exacerbating chronic conditions   COPD on home O2 - Exacerbated SOB in setting of above - Per IM  Anemia, mild - Hg 12.3  11.2 - Daily CBC. Monitor  - Per IM  For questions or updates, please contact [facility] Please consult [email or web address] for contact info under Cardiology/STEMI.      Signed, Rollene Rotunda, MD  Pager [phone] 04/04/2018, 12:44 PM     History and all data above reviewed.  Patient examined.  I agree with the findings as above.  Interesting patient with a history of CAD (She clearly has had CABG.  No details.  We are looking for records. )  She has a history of reduced EF on echo at [facility] earlier this year.   She has atrial fib dating back to at least 2017.  She reports that she has been told this will be permanent.  She was previously on anticoagulation but was not on this at her last discharge from [place].  Her home med list does include beta blocker and calcium channel blocker.  However, it is unlikely that she is compliant with meds.  She comes in with complaints of abdominal pain, skin redness, diffuse edema.  She is in atrial fib with rapid rate.  We are asked to consult   The patient exam reveals [id number]  ,  Lungs: Clear  ,  Abd: Non distended.  , Ext Diffuse edema   .  All available labs, radiology testing, previous records reviewed. Agree with documented assessment and plan.  Atrial fib:  This is chronic/permanent.  I agree with a previous [facility] assessment that she should not be on anticoagulation given a history of falls, ETOH, non adherence.  She is at high risk for bleeding.  She is at high risk for stroke.  Ms. Analleli Gierke has a CHA2DS2 - VASc score of 6.  Unfortunately I think anticoagulation would be higher risk.  We can only try for rate control.   This will be difficult given her pain and agitation.  For now she received a dose of PO Toprol XL  today and I would add PO digoxin and increase the Cardizem.  As BP allows we will transition back to PO Cardizem.   Cardioversion is not an option.   Edema:  Seems to be mostly extremity edema and I don't suspect severe left sided failure.  I would restrict her fluid intake, measure I/O, work on nutrition and if BP allows in the days ahead gently diurese.  I suspect much of this is related to protein calorie malnutrition.      Fayrene Fearing Aulani Shipton  12:46 PM  04/04/2018

## 2018-04-05 ENCOUNTER — Inpatient Hospital Stay (HOSPITAL_COMMUNITY): Payer: Medicaid Other

## 2018-04-05 LAB — CBC
HEMATOCRIT: 33.9 % — AB (ref 36.0–46.0)
HEMOGLOBIN: 10.7 g/dL — AB (ref 12.0–15.0)
MCH: 31 pg (ref 26.0–34.0)
MCHC: 31.6 g/dL (ref 30.0–36.0)
MCV: 98.3 fL (ref 78.0–100.0)
Platelets: 291 10*3/uL (ref 150–400)
RBC: 3.45 MIL/uL — ABNORMAL LOW (ref 3.87–5.11)
RDW: 16.8 % — AB (ref 11.5–15.5)
WBC: 4.9 10*3/uL (ref 4.0–10.5)

## 2018-04-05 LAB — COMPREHENSIVE METABOLIC PANEL
ALBUMIN: 2.2 g/dL — AB (ref 3.5–5.0)
ALT: 39 U/L (ref 0–44)
ANION GAP: 10 (ref 5–15)
AST: 109 U/L — AB (ref 15–41)
Alkaline Phosphatase: 112 U/L (ref 38–126)
BILIRUBIN TOTAL: 1 mg/dL (ref 0.3–1.2)
BUN: 6 mg/dL (ref 6–20)
CHLORIDE: 104 mmol/L (ref 98–111)
CO2: 27 mmol/L (ref 22–32)
Calcium: 8 mg/dL — ABNORMAL LOW (ref 8.9–10.3)
Creatinine, Ser: 0.77 mg/dL (ref 0.44–1.00)
GFR calc Af Amer: >60 mL/min (ref >60)
GFR calc non Af Amer: >60 mL/min (ref >60)
GLUCOSE: 116 mg/dL — AB (ref 70–99)
POTASSIUM: 4.4 mmol/L (ref 3.5–5.1)
SODIUM: 141 mmol/L (ref 135–145)
Total Protein: 4.8 g/dL — ABNORMAL LOW (ref 6.5–8.1)

## 2018-04-05 LAB — C-PEPTIDE: C-Peptide: 1 ng/mL — ABNORMAL LOW (ref 1.1–4.4)

## 2018-04-05 LAB — GLUCOSE, CAPILLARY
GLUCOSE-CAPILLARY: 115 mg/dL — AB (ref 70–99)
GLUCOSE-CAPILLARY: 93 mg/dL (ref 70–99)
Glucose-Capillary: 102 mg/dL — ABNORMAL HIGH (ref 70–99)
Glucose-Capillary: 101 mg/dL — ABNORMAL HIGH (ref 70–99)

## 2018-04-05 LAB — MAGNESIUM: Magnesium: 2 mg/dL (ref 1.7–2.4)

## 2018-04-05 LAB — OCCULT BLOOD X 1 CARD TO LAB, STOOL: Fecal Occult Bld: POSITIVE — AB

## 2018-04-05 LAB — PHOSPHORUS: Phosphorus: 4 mg/dL (ref 2.5–4.6)

## 2018-04-05 MED ORDER — OXYCODONE-ACETAMINOPHEN 5-325 MG PO TABS
2.0000 | ORAL_TABLET | Freq: Once | ORAL | Status: AC
  Administered 2018-04-05: 2 via ORAL
  Filled 2018-04-05: qty 2

## 2018-04-05 MED ORDER — CHLORDIAZEPOXIDE HCL 5 MG PO CAPS
10.0000 mg | ORAL_CAPSULE | Freq: Three times a day (TID) | ORAL | Status: DC
  Administered 2018-04-05 – 2018-04-09 (×14): 10 mg via ORAL
  Filled 2018-04-05 (×14): qty 2

## 2018-04-05 MED ORDER — DILTIAZEM HCL 25 MG/5ML IV SOLN
10.0000 mg | Freq: Four times a day (QID) | INTRAVENOUS | Status: DC | PRN
  Administered 2018-04-11: 10 mg via INTRAVENOUS
  Filled 2018-04-05 (×3): qty 5

## 2018-04-05 MED ORDER — METOPROLOL TARTRATE 25 MG PO TABS
25.0000 mg | ORAL_TABLET | Freq: Three times a day (TID) | ORAL | Status: DC
  Administered 2018-04-05 (×3): 25 mg via ORAL
  Filled 2018-04-05 (×4): qty 1

## 2018-04-05 MED ORDER — LACTATED RINGERS IV SOLN
INTRAVENOUS | Status: DC
  Administered 2018-04-05: 10:00:00 via INTRAVENOUS

## 2018-04-05 MED ORDER — DIGOXIN 0.25 MG/ML IJ SOLN
0.1250 mg | Freq: Four times a day (QID) | INTRAMUSCULAR | Status: AC
  Administered 2018-04-05 (×3): 0.125 mg via INTRAVENOUS
  Filled 2018-04-05 (×3): qty 2

## 2018-04-05 MED ORDER — METOPROLOL TARTRATE 50 MG PO TABS: 50.0000 mg | ORAL_TABLET | Freq: Two times a day (BID) | ORAL | Status: DC

## 2018-04-05 MED ORDER — PRO-STAT SUGAR FREE PO LIQD
30.0000 mL | Freq: Three times a day (TID) | ORAL | Status: DC
  Administered 2018-04-05 – 2018-04-06 (×5): 30 mL via ORAL
  Filled 2018-04-05 (×10): qty 30

## 2018-04-05 MED ORDER — METOPROLOL TARTRATE 5 MG/5ML IV SOLN
5.0000 mg | Freq: Four times a day (QID) | INTRAVENOUS | Status: DC | PRN
  Administered 2018-04-11: 5 mg via INTRAVENOUS
  Filled 2018-04-05 (×2): qty 5

## 2018-04-05 MED ORDER — METOPROLOL TARTRATE 25 MG PO TABS: 25.0000 mg | ORAL_TABLET | Freq: Three times a day (TID) | ORAL | Status: DC

## 2018-04-05 MED ORDER — FUROSEMIDE 10 MG/ML IJ SOLN: 40.0000 mg | Freq: Once | INTRAMUSCULAR | Status: DC

## 2018-04-05 NOTE — Progress Notes (Addendum)
Progress Note  Patient Name: Laurie Lynch Date of Encounter: 04/05/2018  Primary Cardiologist: New to [facility]; Dr. Antoine Poche   Subjective   No complaints of chest pain or SOB today. Has significant swelling in bilateral upper/lower extremities which she states is painful.   Inpatient Medications    Scheduled Meds: . busPIRone  30 mg Oral BID  . DULoxetine  60 mg Oral Daily  . feeding supplement (ENSURE ENLIVE)  237 mL Oral BID BM  . folic acid  1 mg Oral Daily  . gabapentin  600 mg Oral TID  . LORazepam  0-4 mg Oral Q6H   Followed by  . [START ON 04/06/2018] LORazepam  0-4 mg Oral Q12H  . magnesium oxide  400 mg Oral Daily  . metoprolol tartrate  5 mg Intravenous Q6H  . multivitamin with minerals  1 tablet Oral Daily  . pantoprazole  40 mg Oral BID AC  . roflumilast  500 mcg Oral Daily  . sodium chloride flush  3 mL Intravenous Q12H  . vitamin B-1  250 mg Oral Daily  . traZODone  100 mg Oral QHS  . vitamin B-12  500 mcg Oral Daily   Continuous Infusions: . diltiazem (CARDIZEM) infusion 5 mg/hr (04/05/18 0600)   PRN Meds: acetaminophen **OR** acetaminophen, HYDROcodone-acetaminophen, LORazepam **OR** LORazepam, naphazoline-glycerin, ondansetron **OR** ondansetron (ZOFRAN) IV, senna-docusate   Vital Signs    Vitals:   04/05/18 0437 04/05/18 0507 04/05/18 0537 04/05/18 0620  BP: 101/60 (!) 109/56 110/65 117/67  Pulse:    96  Resp: (!) 23 (!) 22 (!) 21 (!) 21  Temp:    98.1 F (36.7 C)  TempSrc:    Oral  SpO2: 93% 93% 95% 94%  Weight:    91.5 kg  Height:        Intake/Output Summary (Last 24 hours) at 04/05/2018 0752 Last data filed at 04/05/2018 0600 Gross per 24 hour  Intake 1081.44 ml  Output 950 ml  Net 131.44 ml   Filed Weights   04/03/18 1527 04/04/18 0000 04/05/18 0620  Weight: 90.7 kg 89.8 kg 91.5 kg    Telemetry    Atrial fibrillation with RVR; rate overall improved over the past 24 hours in the 80s-110s - Personally Reviewed  Physical  Exam   GEN: sitting on the edge of her hospital bed eating breakfast in no acute distress.  Neck: No JVD, no carotid bruits Cardiac: IRIR, no murmurs, rubs, or gallops.  Respiratory: Clear to auscultation bilaterally, no wheezes/ rales/ rhonchi GI: NABS, Soft, nontender, non-distended  MS: 2-3+ bilateral upper/lower extremity edema; sensation intact; good capillary refill. No deformity. Neuro:  Nonfocal, moving all extremities spontaneously Psych: Normal affect   Labs    Chemistry Recent Labs  Lab 04/03/18 1704 04/04/18 0650 04/05/18 0336  NA 138 139 141  K 3.6 3.4* 4.4  CL 97* 100 104  CO2 23 31 27   GLUCOSE 52* 82 116*  BUN 5* 5* 6  CREATININE 0.72 0.75 0.77  CALCIUM 7.7* 7.5* 8.0*  PROT 5.3* 4.7* 4.8*  ALBUMIN 2.6* 2.2* 2.2*  AST 167* 106* 109*  ALT 52* 41 39  ALKPHOS 143* 123 112  BILITOT 1.6* 1.4* 1.0  GFRNONAA >60 >60 >60  GFRAA >60 >60 >60  ANIONGAP 18* 8 10     Hematology Recent Labs  Lab 04/03/18 1704 04/04/18 0650 04/05/18 0336  WBC 5.5 5.1 4.9  RBC 3.94 3.55* 3.45*  HGB 12.3 11.2* 10.7*  HCT 37.8 34.0* 33.9*  MCV 95.9  95.8 98.3  MCH 31.2 31.5 31.0  MCHC 32.5 32.9 31.6  RDW 15.8* 16.0* 16.8*  PLT 352 338 291    Cardiac Enzymes Recent Labs  Lab 04/03/18 1704  TROPONINI <0.03    Recent Labs  Lab 04/03/18 1704  TROPIPOC 0.02     BNP Recent Labs  Lab 04/03/18 1705  BNP 466.4*     DDimer No results for input(s): DDIMER in the last 168 hours.   Radiology    Dg Chest 2 View  Result Date: 04/03/2018 CLINICAL DATA:  Fatigue and atrial fibrillation. Shortness of breath. EXAM: CHEST - 2 VIEW COMPARISON:  None. FINDINGS: The heart size is borderline. The hila, mediastinum, lungs, and pleura are unremarkable. IMPRESSION: No active cardiopulmonary disease. Electronically Signed   By: Gerome Sam III M.D   On: 04/03/2018 18:02   Ct Abdomen Pelvis W Contrast  Result Date: 04/03/2018 CLINICAL DATA:  57 year old female with history of  fatigue for the past 3 days. Black tarry stools. Peripheral edema. EXAM: CT ABDOMEN AND PELVIS WITH CONTRAST TECHNIQUE: Multidetector CT imaging of the abdomen and pelvis was performed using the standard protocol following bolus administration of intravenous contrast. CONTRAST:  ISOVUE-300 IOPAMIDOL (ISOVUE-300) INJECTION 61% COMPARISON:  No priors. FINDINGS: Lower chest: Cardiomegaly. Atherosclerotic calcifications in the descending thoracic aorta, left circumflex and right coronary arteries. Hepatobiliary: Diffuse low attenuation throughout the hepatic parenchyma, indicative of severe hepatic steatosis. No suspicious cystic or solid hepatic lesions. No intra or extrahepatic biliary ductal dilatation. Gallbladder is normal in appearance. Pancreas: No pancreatic mass. No pancreatic ductal dilatation. No pancreatic or peripancreatic fluid or inflammatory changes. Spleen: Unremarkable. Adrenals/Urinary Tract: Bilateral kidneys and bilateral adrenal glands are normal in appearance. No hydroureteronephrosis. Urinary bladder is normal in appearance. Stomach/Bowel: Normal appearance of the stomach. No pathologic dilatation of small bowel or colon. Numerous colonic diverticulae are noted, without surrounding inflammatory changes to suggest an acute diverticulitis at this time. Normal appendix. Vascular/Lymphatic: Aortic atherosclerosis, without evidence of aneurysm or dissection in the abdominal or pelvic vasculature. No lymphadenopathy noted in the abdomen or pelvis. Reproductive: Uterus and ovaries are unremarkable in appearance. Other: No significant volume of ascites.  No pneumoperitoneum. Musculoskeletal: There are no aggressive appearing lytic or blastic lesions noted in the visualized portions of the skeleton. IMPRESSION: 1. No acute findings are noted in the abdomen or pelvis to account for the patient's symptoms. 2. Severe hepatic steatosis. 3. Colonic diverticulosis without evidence of acute diverticulitis  at this time. 4. Aortic atherosclerosis, in addition to least 2 vessel coronary artery disease. Please note that although the presence of coronary artery calcium documents the presence of coronary artery disease, the severity of this disease and any potential stenosis cannot be assessed on this non-gated CT examination. Assessment for potential risk factor modification, dietary therapy or pharmacologic therapy may be warranted, if clinically indicated. 5. Cardiomegaly. Electronically Signed   By: Trudie Reed M.D.   On: 04/03/2018 19:51    Cardiac Studies   TTE 10/03/2017 There is normal left ventricular wall thickness. The left ventricular ejection fraction is mildly reduced (45-50%). Septal motion is consistent with conduction abnormality. The aortic valve is trileaflet. Unable to adequately determine diastolic dysfunction. The mitral valve leaflets appear thickened, but open well. There is mild-moderate (1-2+) mitral regurgitation. There is moderate (2+) tricuspid regurgitation. Right ventricular systolic pressure is elevated between 50-66mm Hg, consistent with moderately severe pulmonary hypertension. The left atrium is moderately dilated. The right atrium is mildly dilated.  Patient Profile  57 y.o. female with a PMH of chronic atrial fibrillation on Eliquis, noncompliance, alcoholism on CIWA protocol, ?HFrEF (09/2017 EF 45-50%), frequent falls, COPD (prescribed home oxygen), depression and anxiety, who is being followed by cardiology for afib RVR.   Assessment & Plan    1. Atrial fibrillation with RVR: Likely occurred in the setting of medication non-compliance having not taken her medications for 2-3 days. HR much better in the past 8 hours with diltiazem gtt. Felt to be a poor candidate for anticoagulation given alcoholism and fall risk despite CHA2DS2VASc score of 6.   2. Acute on chronic combined CHF: Echo 09/2017 with EF 45-50%. She does have LE edema on exam but felt that  this is contributed to by protein malnutrition. She was recommended for fluid restriction yesterday. Diuresis held given soft blood pressures and need for rate control.   3. HTN: BP stable but on the soft side - Continue to monitor closely   4. Alcohol abuse: currently on CIWA protocol - Continue management per primary team  5. ? GI bleed: +FOBT this admission. Hgb trend: 12.3>11.2>10.7. Not on anticoagulation.  - Continue management per primary team.    For questions or updates, please contact [facility] Please consult [email or web address] for contact info under Cardiology/STEMI.      Signed, Beatriz Stallion, PA-C  04/05/2018, 7:52 AM   [phone]   History and all data above reviewed.  Patient examined.  I agree with the findings as above.  Somnolent now.  Not having pain during this exam like she was yesterday.  The patient exam reveals [id number]  ,  Lungs: Few basilar crackles  ,  Abd: Positive bowel sounds, no rebound no guarding, Ext Diffuse edema  .  All available labs, radiology testing, previous records reviewed. Agree with documented assessment and plan. Atrial fib:  I would like to try to transition to beta blocker today.  Start metoprolol tartrate q8 hours and try to stop IV dilt.  Not an anticoagulation candidate.  Edema:  She is oxygenating well.  For today I would like to hold off on diuresis so that I have enough BP to try to titrate her meds for rate control.    Davian Hanshaw  10:00 AM  04/05/2018

## 2018-04-05 NOTE — Progress Notes (Addendum)
[place] TEAM 1 - Stepdown/ICU TEAM  Deshun Lorman  OHY:[id number] DOB: Dec 04, 1960 DOA: 04/03/2018 PCP: Madison Hickman, MD    Chief Complaint  Patient presents with  . Fatigue  . Atrial Fibrillation   Brief Narrative:  57 y.o. female with a hx of chronic atrial fibrillation on Eliquis, depression with anxiety, COPD, and alcoholism who presented to the ED w/ periumbilical abdominal pain and melena. Patient was admitted to a [facility] hospital late last month and discharged several days prior to this admit after treatment for acute kidney injury and alcohol-related complications. She was advised to stop Lasix and reduce diltiazem due to low BP's, and to stop Eliquis given recurrent falls and ongoing alcoholism. After returning home she noted general malaise with poor appetite, tenderness in the periumbilical region, and black tarry stools.  She admitted to ongoing daily alcohol use.  She called EMS, was found to be in atrial fibrillation with rapid rate, given a dose of Lopressor and fentanyl, and brought to the ED.  In the ED EKG featured atrial fibrillation w/ RVR. CT abd/pelvis was negative for acute findings but notable for severe hepatic steatosis.  CBC was unremarkable.  DRE revealed brown stool that was FOBT positive.    Significant Events: 9/2 admit   Subjective:   Patient in bed, appears comfortable, denies any headache, no fever, no chest pain or pressure, no shortness of breath , no abdominal pain. No focal weakness.   Assessment & Plan:  Chronic Atrial fibrillation with acute RVR   -  not taking her medications for 2-3 days - CHADS-VASc is 6 but she is too high risk for bleeding to anticoagulate - TSH is not low -currently on Cardizem drip which will be continued, have increased Lopressor dose, 3 doses of IV digoxin, goal would be rate control, once controlled will titrate oral medications and discharge home.  Hypomagnesemia and hypokalemia   -   Supplemented and stable.  Acute on  Chronic combined systolic and diastolic heart failure -  09/2017 TTE noted EF 45-50% - has LE edema (likely oncotic) but does not appear to be total body volume overloaded, continue supportive care, continue beta-blocker, will monitor closely.  Hypoglycemia -  No hx of DM - A1c 5.04 October 2017 - likely secondary to alcoholism -had stable random cortisol, TSH, BHOB, pending c-peptide, proinsulin/insulin, BHOB, and sulfonylurea, currently CBG stable no acute issue.  Alcoholism with early alcohol withdrawal -  Patient acknowledged long hx of excessive daily drinking  counseled to quit, appears to be in early DTs, have added scheduled Librium along with CIWA protocol.  Gentle hydration.  Hypoalbuminemia -  albumin 2.6 on admission - secondary to alcoholism with liver disease and poor nutrition, placed on pro-stat.this causes her chronic third spacing and edema.  Occult GI bleeding - brown stool on DRE that is FOBT positive -stable H&H placed on PPI.  Outpatient GI follow-up.  Depression with anxiety -  Continue Cymbalta, Buspar, and trazodone.    Abdominal pain - CT negative for acute findings - appears to be a chronic issue based on review of EMR, exam is benign.  COPD - Well compensated at this time.   Borderline elevated TSH  - FT4 is normal -this is sick euthyroid syndrome.  No further work-up.   DVT prophylaxis: SCDs Code Status: FULL CODE Family Communication: no family present at time of exam  Disposition Plan: SDU  Consultants:  Cardiology   Anti-infectives (From admission, onward)   Start  Dose/Rate Route Frequency Ordered Stop   04/04/18 0700  vancomycin (VANCOCIN) 1,250 mg in sodium chloride 0.9 % 250 mL IVPB  Status:  Discontinued     1,250 mg 166.7 mL/hr over 90 Minutes Intravenous Every 12 hours 04/03/18 1753 04/03/18 2226   04/03/18 2300  piperacillin-tazobactam (ZOSYN) IVPB 3.375 g  Status:  Discontinued     3.375 g 12.5 mL/hr over 240 Minutes Intravenous Every 8  hours 04/03/18 1753 04/03/18 2226   04/03/18 1615  piperacillin-tazobactam (ZOSYN) IVPB 3.375 g     3.375 g 100 mL/hr over 30 Minutes Intravenous  Once 04/03/18 1611 04/03/18 1818   04/03/18 1615  vancomycin (VANCOCIN) 2,000 mg in sodium chloride 0.9 % 500 mL IVPB     2,000 mg 250 mL/hr over 120 Minutes Intravenous  Once 04/03/18 1611 04/03/18 2247      Objective:  Blood pressure 117/67, pulse 96, temperature 98.1 F (36.7 C), temperature source Oral, resp. rate (!) 21, height 5\' 9"  (1.753 m), weight 91.5 kg, SpO2 94 %.  Examination:  Awake Alert, Oriented X 3, No new F.N deficits, mild tremors [place].AT,PERRAL Supple Neck,No JVD, No cervical lymphadenopathy appriciated.  Symmetrical Chest wall movement, Good air movement bilaterally, CTAB RRR,No Gallops, Rubs or new Murmurs, No Parasternal Heave +ve B.Sounds, Abd Soft, No tenderness, No organomegaly appriciated, No rebound - guarding or rigidity. No Cyanosis, Clubbing , +ve edema, No new Rash or bruise   CBC: Recent Labs  Lab 04/03/18 1704 04/04/18 0650 04/05/18 0336  WBC 5.5 5.1 4.9  NEUTROABS 3.3  --   --   HGB 12.3 11.2* 10.7*  HCT 37.8 34.0* 33.9*  MCV 95.9 95.8 98.3  PLT 352 338 291   Basic Metabolic Panel: Recent Labs  Lab 04/03/18 1704 04/04/18 0650 04/05/18 0336  NA 138 139 141  K 3.6 3.4* 4.4  CL 97* 100 104  CO2 23 31 27   GLUCOSE 52* 82 116*  BUN 5* 5* 6  CREATININE 0.72 0.75 0.77  CALCIUM 7.7* 7.5* 8.0*  MG  --  1.5* 2.0  PHOS  --   --  4.0   GFR: Estimated Creatinine Clearance: 92.3 mL/min (by C-G formula based on SCr of 0.77 mg/dL).  Liver Function Tests: Recent Labs  Lab 04/03/18 1704 04/04/18 0650 04/05/18 0336  AST 167* 106* 109*  ALT 52* 41 39  ALKPHOS 143* 123 112  BILITOT 1.6* 1.4* 1.0  PROT 5.3* 4.7* 4.8*  ALBUMIN 2.6* 2.2* 2.2*   Recent Labs  Lab 04/03/18 1704  LIPASE 27   Lab Results  Component Value Date   TSH 4.803 (H) 04/03/2018     Cardiac Enzymes: Recent Labs    Lab 04/03/18 1704  TROPONINI <0.03    HbA1C: Hgb A1c MFr Bld  Date/Time Value Ref Range Status  04/03/2018 11:49 PM 5.2 4.8 - 5.6 % Final    Comment:    (NOTE) Pre diabetes:          5.7%-6.4% Diabetes:              >6.4% Glycemic control for   <7.0% adults with diabetes     CBG: Recent Labs  Lab 04/04/18 1613 04/04/18 2021 04/04/18 2339 04/05/18 0358 04/05/18 0803  GLUCAP 95 112* 127* 102* 115*    Recent Results (from the past 240 hour(s))  Blood culture (routine x 2)     Status: None (Preliminary result)   Collection Time: 04/03/18  4:12 PM  Result Value Ref Range Status   Specimen  Description BLOOD RIGHT ARM  Final   Special Requests   Final    BOTTLES DRAWN AEROBIC AND ANAEROBIC Blood Culture adequate volume   Culture   Final    NO GROWTH 2 DAYS Performed at [facility], [address] [place], [place] [address]    Report Status PENDING  Incomplete  MRSA PCR Screening     Status: None   Collection Time: 04/03/18 11:59 PM  Result Value Ref Range Status   MRSA by PCR NEGATIVE NEGATIVE Final    Comment:        The GeneXpert MRSA Assay (FDA approved for NASAL specimens only), is one component of a comprehensive MRSA colonization surveillance program. It is not intended to diagnose MRSA infection nor to guide or monitor treatment for MRSA infections. Performed at [facility], [address] [place], [place] [address]      Scheduled Meds: . busPIRone  30 mg Oral BID  . chlordiazePOXIDE  10 mg Oral TID  . DULoxetine  60 mg Oral Daily  . feeding supplement (ENSURE ENLIVE)  237 mL Oral BID BM  . folic acid  1 mg Oral Daily  . gabapentin  600 mg Oral TID  . LORazepam  0-4 mg Oral Q6H   Followed by  . [START ON 04/06/2018] LORazepam  0-4 mg Oral Q12H  . magnesium oxide  400 mg Oral Daily  . metoprolol tartrate  5 mg Intravenous Q6H  . metoprolol tartrate  25 mg Oral TID  . multivitamin with minerals  1 tablet Oral Daily  . pantoprazole   40 mg Oral BID AC  . roflumilast  500 mcg Oral Daily  . sodium chloride flush  3 mL Intravenous Q12H  . vitamin B-1  250 mg Oral Daily  . traZODone  100 mg Oral QHS  . vitamin B-12  500 mcg Oral Daily   Continuous Infusions: . diltiazem (CARDIZEM) infusion 7.5 mg/hr (04/05/18 0720)  . lactated ringers 75 mL/hr at 04/05/18 0940     LOS: 1 day   Signature  Susa Raring M.D on 04/05/2018 at 10:16 AM  To page go to [email or web address] - password [facility]

## 2018-04-06 LAB — COMPREHENSIVE METABOLIC PANEL
ALT: 69 U/L — ABNORMAL HIGH (ref 0–44)
ANION GAP: 11 (ref 5–15)
AST: 327 U/L — ABNORMAL HIGH (ref 15–41)
Albumin: 2.4 g/dL — ABNORMAL LOW (ref 3.5–5.0)
Alkaline Phosphatase: 136 U/L — ABNORMAL HIGH (ref 38–126)
BUN: 8 mg/dL (ref 6–20)
CALCIUM: 8.4 mg/dL — AB (ref 8.9–10.3)
CO2: 26 mmol/L (ref 22–32)
Chloride: 103 mmol/L (ref 98–111)
Creatinine, Ser: 0.8 mg/dL (ref 0.44–1.00)
GFR calc Af Amer: >60 mL/min (ref >60)
Glucose, Bld: 90 mg/dL (ref 70–99)
POTASSIUM: 6 mmol/L — AB (ref 3.5–5.1)
Sodium: 140 mmol/L (ref 135–145)
TOTAL PROTEIN: 4.9 g/dL — AB (ref 6.5–8.1)
Total Bilirubin: 1.4 mg/dL — ABNORMAL HIGH (ref 0.3–1.2)

## 2018-04-06 LAB — CBC
HEMATOCRIT: 37.9 % (ref 36.0–46.0)
Hemoglobin: 11.9 g/dL — ABNORMAL LOW (ref 12.0–15.0)
MCH: 31.6 pg (ref 26.0–34.0)
MCHC: 31.4 g/dL (ref 30.0–36.0)
MCV: 100.5 fL — AB (ref 78.0–100.0)
Platelets: 345 10*3/uL (ref 150–400)
RBC: 3.77 MIL/uL — ABNORMAL LOW (ref 3.87–5.11)
RDW: 16.5 % — AB (ref 11.5–15.5)
WBC: 5 10*3/uL (ref 4.0–10.5)

## 2018-04-06 LAB — GLUCOSE, CAPILLARY
GLUCOSE-CAPILLARY: 77 mg/dL (ref 70–99)
GLUCOSE-CAPILLARY: 97 mg/dL (ref 70–99)
Glucose-Capillary: 86 mg/dL (ref 70–99)
Glucose-Capillary: 91 mg/dL (ref 70–99)
Glucose-Capillary: 99 mg/dL (ref 70–99)
Glucose-Capillary: 96 mg/dL (ref 70–99)
Glucose-Capillary: 81 mg/dL (ref 70–99)

## 2018-04-06 LAB — C DIFFICILE QUICK SCREEN W PCR REFLEX
C Diff antigen: NEGATIVE
C Diff interpretation: NOT DETECTED
C Diff toxin: NEGATIVE

## 2018-04-06 LAB — MAGNESIUM: MAGNESIUM: 1.8 mg/dL (ref 1.7–2.4)

## 2018-04-06 MED ORDER — HYDROCODONE-ACETAMINOPHEN 5-325 MG PO TABS
1.0000 | ORAL_TABLET | Freq: Four times a day (QID) | ORAL | Status: DC | PRN
  Administered 2018-04-06 – 2018-04-15 (×13): 1 via ORAL
  Filled 2018-04-06 (×14): qty 1

## 2018-04-06 MED ORDER — METOPROLOL SUCCINATE ER 100 MG PO TB24
100.0000 mg | ORAL_TABLET | Freq: Every day | ORAL | Status: DC
  Administered 2018-04-06 – 2018-04-07 (×2): 100 mg via ORAL
  Filled 2018-04-06 (×2): qty 1

## 2018-04-06 MED ORDER — ALBUTEROL SULFATE (2.5 MG/3ML) 0.083% IN NEBU
2.5000 mg | INHALATION_SOLUTION | RESPIRATORY_TRACT | Status: DC | PRN
  Administered 2018-04-06 – 2018-04-15 (×6): 2.5 mg via RESPIRATORY_TRACT
  Filled 2018-04-06 (×6): qty 3

## 2018-04-06 MED ORDER — SODIUM POLYSTYRENE SULFONATE 15 GM/60ML PO SUSP
15.0000 g | Freq: Once | ORAL | Status: AC
  Administered 2018-04-06: 15 g via ORAL
  Filled 2018-04-06: qty 60

## 2018-04-06 MED ORDER — FUROSEMIDE 10 MG/ML IJ SOLN
40.0000 mg | Freq: Two times a day (BID) | INTRAMUSCULAR | Status: DC
  Administered 2018-04-06 – 2018-04-09 (×6): 40 mg via INTRAVENOUS
  Filled 2018-04-06 (×6): qty 4

## 2018-04-06 MED ORDER — APIXABAN 5 MG PO TABS
5.0000 mg | ORAL_TABLET | Freq: Two times a day (BID) | ORAL | Status: DC
  Administered 2018-04-06 – 2018-04-11 (×11): 5 mg via ORAL
  Filled 2018-04-06 (×11): qty 1

## 2018-04-06 MED ORDER — DIPHENOXYLATE-ATROPINE 2.5-0.025 MG PO TABS
2.0000 | ORAL_TABLET | Freq: Once | ORAL | Status: AC
  Administered 2018-04-06: 2 via ORAL
  Filled 2018-04-06: qty 2

## 2018-04-06 MED ORDER — FUROSEMIDE 10 MG/ML IJ SOLN
40.0000 mg | Freq: Once | INTRAMUSCULAR | Status: AC
  Administered 2018-04-06: 40 mg via INTRAVENOUS
  Filled 2018-04-06: qty 4

## 2018-04-06 MED ORDER — ASPIRIN 81 MG PO CHEW: 81.0000 mg | CHEWABLE_TABLET | Freq: Every day | ORAL | Status: DC

## 2018-04-06 MED ORDER — MORPHINE SULFATE (PF) 4 MG/ML IV SOLN
4.0000 mg | Freq: Once | INTRAVENOUS | Status: AC
  Administered 2018-04-06: 4 mg via INTRAVENOUS
  Filled 2018-04-06: qty 1

## 2018-04-06 MED ORDER — OXYCODONE-ACETAMINOPHEN 5-325 MG PO TABS
2.0000 | ORAL_TABLET | Freq: Once | ORAL | Status: AC
  Administered 2018-04-06: 2 via ORAL
  Filled 2018-04-06: qty 2

## 2018-04-06 NOTE — Progress Notes (Signed)
Spoke with Dr Thedore Mins this am and addressed pt's VS and increase in weight D/T extreme edema and that I turned her IVF's off since her B/P was more stable, verbal orders received for 40mg  IV Lasix, will give as soon as up.

## 2018-04-06 NOTE — Progress Notes (Signed)
Patient requested prn neb tx.

## 2018-04-06 NOTE — Progress Notes (Signed)
ANTICOAGULATION CONSULT NOTE - Initial Consult  Pharmacy Consult for Apixaban (Eliquis) Indication: Nonvalvular Atrial Fibrillation  No Known Allergies  Patient Measurements: Height: 5\' 9"  (175.3 cm) Weight: 205 lb 3.2 oz (93.1 kg) IBW/kg (Calculated) : 66.2   Vital Signs: Temp: 97.6 F (36.4 C) (09/05 0602) Temp Source: Oral (09/05 0754) BP: 121/79 (09/05 1112) Pulse Rate: 96 (09/05 1112)  Labs: Recent Labs    04/03/18 1704 04/04/18 0650 04/05/18 0336 04/06/18 0258  HGB 12.3 11.2* 10.7* 11.9*  HCT 37.8 34.0* 33.9* 37.9  PLT 352 338 291 345  CREATININE 0.72 0.75 0.77 0.80  TROPONINI <0.03  --   --   --     Estimated Creatinine Clearance: 93.2 mL/min (by C-G formula based on SCr of 0.8 mg/dL).   Medical History: Past Medical History:  Diagnosis Date  . Arthritis   . CHF (congestive heart failure) (HCC)   . COPD (chronic obstructive pulmonary disease) (HCC)   . Coronary artery disease   . Hypertension     Medications:  Medications Prior to Admission  Medication Sig Dispense Refill Last Dose  . albuterol (PROVENTIL) (2.5 MG/3ML) 0.083% nebulizer solution Take 2.5 mg by nebulization 2 (two) times daily as needed for wheezing or shortness of breath.    Unk at [facility]  . albuterol (VENTOLIN HFA) 108 (90 Base) MCG/ACT inhaler Inhale 2 puffs into the lungs every 6 (six) hours as needed for wheezing or shortness of breath.   Unk at [facility]  . apixaban (ELIQUIS) 5 MG TABS tablet Take 5 mg by mouth 2 (two) times daily.   04/01/2018 at 2000  . atorvastatin (LIPITOR) 40 MG tablet Take 40 mg by mouth at bedtime.   04/01/2018 at pm  . busPIRone (BUSPAR) 30 MG tablet Take 30 mg by mouth 2 (two) times daily.   04/01/2018 at Unknown time  . diltiazem (TIAZAC) 360 MG 24 hr capsule Take 360 mg by mouth daily.   Past Week at Unknown time  . DULoxetine (CYMBALTA) 30 MG capsule Take 60 mg by mouth daily.   04/01/2018 at Unknown time  . folic acid (FOLVITE) 1 MG tablet Take 1 mg by mouth daily.    Past Week at Unknown time  . furosemide (LASIX) 40 MG tablet Take 40 mg by mouth daily.   04/01/2018 at Unknown time  . gabapentin (NEURONTIN) 300 MG capsule Take 900 mg by mouth 2 (two) times daily.   04/01/2018 at Unknown time  . Magnesium Oxide 500 MG TABS Take 500 mg by mouth daily.    04/01/2018 at Unknown time  . metoprolol succinate (TOPROL-XL) 100 MG 24 hr tablet Take 100 mg by mouth daily. Take with or immediately following a meal.   04/03/2018 at 1500  . nitroGLYCERIN (NITROSTAT) 0.4 MG SL tablet Place 0.4 mg under the tongue every 5 (five) minutes x 3 doses as needed for chest pain.   Unk at [employer]  . Omega-3 Fatty Acids (FISH OIL PO) Take 1 capsule by mouth daily.   Past Week at Unknown time  . roflumilast (DALIRESP) 500 MCG TABS tablet Take 500 mcg by mouth daily.   04/01/2018 at Unknown time  . tetrahydrozoline-zinc (VISINE-AC) 0.05-0.25 % ophthalmic solution Place 2 drops into both eyes 3 (three) times daily as needed (for itching).   Past Week at Unknown time  . Thiamine HCl (VITAMIN B-1) 250 MG tablet Take 250 mg by mouth daily.   Past Week at Unknown time  . traZODone (DESYREL) 100 MG tablet Take  100 mg by mouth at bedtime.   04/01/2018 at pm  . vitamin B-12 (CYANOCOBALAMIN) 500 MCG tablet Take 500 mcg by mouth daily.   Past Week at Unknown time   Scheduled:  . busPIRone  30 mg Oral BID  . chlordiazePOXIDE  10 mg Oral TID  . DULoxetine  60 mg Oral Daily  . feeding supplement (ENSURE ENLIVE)  237 mL Oral BID BM  . feeding supplement (PRO-STAT SUGAR FREE 64)  30 mL Oral TID WC  . folic acid  1 mg Oral Daily  . furosemide  40 mg Intravenous Q12H  . gabapentin  600 mg Oral TID  . LORazepam  0-4 mg Oral Q12H  . magnesium oxide  400 mg Oral Daily  . metoprolol succinate  100 mg Oral Daily  . multivitamin with minerals  1 tablet Oral Daily  . pantoprazole  40 mg Oral BID AC  . roflumilast  500 mcg Oral Daily  . sodium chloride flush  3 mL Intravenous Q12H  . vitamin B-1  250 mg Oral  Daily  . vitamin B-12  500 mcg Oral Daily    Assessment: 57 y.o.femalewith a hx of chronic atrial fibrillation on Eliquis PTA , 5mg  bid, last dose taken 8/31 PTA.  Eliquis held on admit.  [facility] and discharged several days prior to this admit after treatment for acute kidney injury and alcohol-related complications. Noted that patient was advised to stop Eliquis given recurrent falls and ongoing alcoholism.  In ED,  CT abd/pelvis was negative for acute findings but notable for severe hepatic steatosis. CBC was unremarkable. DRE revealed brown stool that was FOBT positive. Dr. Thedore Mins notes stable H&H placed on PPI. Outpatient GI follow-up. Currently no acute bleeding reported. CHADS-VASc is 6.  Cardiology consulted, Dr. Antoine Poche noted that patient is not a candidate for anticoagulation given alcoholism and fall risk. Today Dr. Thedore Mins has consulted pharmacy to restart Eliquis for nonvalvular atrial fibrillation , noting that he will defer long-term continuation of eliquis to PCP and primary cardiologist.  Hgb 11.9 low/stable, pltc wnl.   Age 57y.o, SCr <1.5, wt 93 kg->> I will restart PTA Eliquis 5mg  bid    Goal of Therapy:  Monitor platelets by anticoagulation protocol: Yes   Plan:  restart PTA Eliquis 5mg  bid Monitor for s/sx of bleeding, renal function.    Thank you for allowing pharmacy to be part of this patients care team. Noah Delaine, RPh Clinical Pharmacist Pager: [phone] Please check AMION for all [facility] phone numbers After 10:00 PM, call Main Pharmacy [phone] 04/06/2018,11:45 AM

## 2018-04-06 NOTE — Progress Notes (Signed)
Patient has had several runny, foul smelling stools, will place in enteric precautions as per [facility] and send a stool for C-Diff, no other changes at this time, will continue to monitor.

## 2018-04-06 NOTE — Progress Notes (Signed)
Stool sent for OB x 1 as per order, will continue to monitor.

## 2018-04-06 NOTE — Progress Notes (Signed)
Patient is almost in tears because her extremities are very swollen, tight and painful, only has Tylenol for pain, will see if I can get something else for pain. Patient had an accident and was placed on [facility] to try to have a BM, will continue to monitor.

## 2018-04-06 NOTE — Progress Notes (Signed)
Patient is crying in pain at this time rated 10/10, tight and tingling/aching, was sleeping for a little while but also having a lot of loose stools, text paged [facility], C-Diff negative but OB positive, will give some pain meds and lomotil, restrict her fluids and try to get her through the night at least a little more comfortable, notified Ralph Leyden of the fact that patient was given 2 MG po Ativan to help with the anxiety she's experiencing since her pain has not been reduced much, will give 4 mg IV Morphine and some Lomotil and continue to monitor, HR 80-90's A-Fib with B/P 120's/60-80's and part is D/T her extreme pain, will continue to monitor.

## 2018-04-06 NOTE — Progress Notes (Signed)
Progress Note  Patient Name: Laurie Lynch Date of Encounter: 04/06/2018  Primary Cardiologist:   No primary care provider on file.   Subjective   Diffuse pain when you touch her skin.  Tremors.  No acute SOB .  Inpatient Medications    Scheduled Meds: . busPIRone  30 mg Oral BID  . chlordiazePOXIDE  10 mg Oral TID  . DULoxetine  60 mg Oral Daily  . feeding supplement (ENSURE ENLIVE)  237 mL Oral BID BM  . feeding supplement (PRO-STAT SUGAR FREE 64)  30 mL Oral TID WC  . folic acid  1 mg Oral Daily  . furosemide  40 mg Intravenous Q12H  . gabapentin  600 mg Oral TID  . LORazepam  0-4 mg Oral Q12H  . magnesium oxide  400 mg Oral Daily  . metoprolol tartrate  25 mg Oral TID  . multivitamin with minerals  1 tablet Oral Daily  . pantoprazole  40 mg Oral BID AC  . roflumilast  500 mcg Oral Daily  . sodium chloride flush  3 mL Intravenous Q12H  . sodium polystyrene  15 g Oral Once  . vitamin B-1  250 mg Oral Daily  . vitamin B-12  500 mcg Oral Daily   Continuous Infusions: . lactated ringers Stopped (04/06/18 0700)   PRN Meds: acetaminophen **OR** acetaminophen, diltiazem, LORazepam **OR** LORazepam, metoprolol tartrate, naphazoline-glycerin, [DISCONTINUED] ondansetron **OR** ondansetron (ZOFRAN) IV, senna-docusate   Vital Signs    Vitals:   04/06/18 0340 04/06/18 0440 04/06/18 0602 04/06/18 0754  BP: (!) 102/55 90/67 126/79 113/86  Pulse:   87 99  Resp: 16 (!) 9 12 10   Temp:   97.6 F (36.4 C)   TempSrc:   Oral Oral  SpO2: 95% 92% 97% 95%  Weight:   93.1 kg   Height:        Intake/Output Summary (Last 24 hours) at 04/06/2018 0859 Last data filed at 04/06/2018 0754 Gross per 24 hour  Intake 2031.37 ml  Output 800 ml  Net 1231.37 ml   Filed Weights   04/04/18 0000 04/05/18 0620 04/06/18 0602  Weight: 89.8 kg 91.5 kg 93.1 kg    Telemetry    Atrial fib with rate improved.  - Personally Reviewed  ECG    NA - Personally Reviewed  Physical Exam   GEN:  No acute distress.  Tremors Neck: No  JVD Cardiac: Irregular RR, no murmurs, rubs, or gallops.  Respiratory: Clear  to auscultation bilaterally. GI: Soft, nontender, non-distended  MS:    Diffuse edema; No deformity. Neuro:  Nonfocal  Psych: Normal affect   Labs    Chemistry Recent Labs  Lab 04/04/18 0650 04/05/18 0336 04/06/18 0258  NA 139 141 140  K 3.4* 4.4 6.0*  CL 100 104 103  CO2 31 27 26   GLUCOSE 82 116* 90  BUN 5* 6 8  CREATININE 0.75 0.77 0.80  CALCIUM 7.5* 8.0* 8.4*  PROT 4.7* 4.8* 4.9*  ALBUMIN 2.2* 2.2* 2.4*  AST 106* 109* 327*  ALT 41 39 69*  ALKPHOS 123 112 136*  BILITOT 1.4* 1.0 1.4*  GFRNONAA >60 >60 >60  GFRAA >60 >60 >60  ANIONGAP 8 10 11      Hematology Recent Labs  Lab 04/04/18 0650 04/05/18 0336 04/06/18 0258  WBC 5.1 4.9 5.0  RBC 3.55* 3.45* 3.77*  HGB 11.2* 10.7* 11.9*  HCT 34.0* 33.9* 37.9  MCV 95.8 98.3 100.5*  MCH 31.5 31.0 31.6  MCHC 32.9 31.6 31.4  RDW  16.0* 16.8* 16.5*  PLT 338 291 345    Cardiac Enzymes Recent Labs  Lab 04/03/18 1704  TROPONINI <0.03    Recent Labs  Lab 04/03/18 1704  TROPIPOC 0.02     BNP Recent Labs  Lab 04/03/18 1705  BNP 466.4*     DDimer No results for input(s): DDIMER in the last 168 hours.   Radiology    Dg Abd Portable 1v  Result Date: 04/05/2018 CLINICAL DATA:  Abdominal pain EXAM: PORTABLE ABDOMEN - 1 VIEW COMPARISON:  CT abdomen and pelvis April 03, 2018 FINDINGS: There is moderate stool in the colon. There is no bowel dilatation or air-fluid level to suggest bowel obstruction. No free air. No abnormal calcifications. IMPRESSION: No evident bowel obstruction or free air on this supine examination. Electronically Signed   By: Bretta Bang III M.D.   On: 04/05/2018 09:52    Cardiac Studies   TTE 10/03/2017 There is normal left ventricular wall thickness. The left ventricular ejection fraction is mildly reduced (45-50%). Septal motion is consistent with conduction  abnormality. The aortic valve is trileaflet. Unable to adequately determine diastolic dysfunction. The mitral valve leaflets appear thickened, but open well. There is mild-moderate (1-2+) mitral regurgitation. There is moderate (2+) tricuspid regurgitation. Right ventricular systolic pressure is elevated between 50-70mm Hg, consistent with moderately severe pulmonary hypertension. The left atrium is moderately dilated. The right atrium is mildly dilated.  Patient Profile     57 y.o. female with a PMH of chronic atrial fibrillation on Eliquis,noncompliance, alcoholism on CIWA protocol, ?HFrEF (09/2017 EF 45-50%), frequent falls, COPD (prescribed home oxygen), depression and anxiety, who is being followed by cardiology for afib RVR.   Assessment & Plan    ATRIAL FIB:   Not an anticoag candidate.  Switched to metoprolol tartrate and given IV dig yesterday.  Cardizem off.  I will change to Toprol XL and increase the dose slightly.  HR is better.  We might be able to avoid dig which has a low therapeutic to toxic ratio and would best be avoided long term.    ACUTE ON CHRONIC COMBINED SYSTOLIC AND DIASTOLIC HF:  Intake and output are incomplete.  Possibly start ACE/ARB before discharge.   HTN:  BP low but tolerating meds.   For questions or updates, please contact [facility] Please consult [email or web address] for contact info under Cardiology/STEMI.   Signed, Rollene Rotunda, MD  04/06/2018, 8:59 AM

## 2018-04-06 NOTE — Progress Notes (Addendum)
[place] TEAM 1 - Stepdown/ICU TEAM  Laurie Lynch  EXM:[id number] DOB: 08-Mar-1961 DOA: 04/03/2018 PCP: Madison Hickman, MD    Chief Complaint  Patient presents with  . Fatigue  . Atrial Fibrillation   Brief Narrative:  57 y.o. female with a hx of chronic atrial fibrillation on Eliquis, depression with anxiety, COPD, and alcoholism who presented to the ED w/ periumbilical abdominal pain and melena. Patient was admitted to a [facility] hospital late last month and discharged several days prior to this admit after treatment for acute kidney injury and alcohol-related complications. She was advised to stop Lasix and reduce diltiazem due to low BP's, and to stop Eliquis given recurrent falls and ongoing alcoholism. After returning home she noted general malaise with poor appetite, tenderness in the periumbilical region, and black tarry stools.  She admitted to ongoing daily alcohol use.  She called EMS, was found to be in atrial fibrillation with rapid rate, given a dose of Lopressor and fentanyl, and brought to the ED.  In the ED EKG featured atrial fibrillation w/ RVR. CT abd/pelvis was negative for acute findings but notable for severe hepatic steatosis.  CBC was unremarkable.  DRE revealed brown stool that was FOBT positive.    Significant Events: 9/2 admit   Subjective: Patient in bed, appears comfortable, denies any headache, no fever, no chest pain or pressure, no shortness of breath , no abdominal pain. No focal weakness.  She is complaining of no pain at this time.   Assessment & Plan:  Chronic Atrial fibrillation with acute RVR   -  not taking her medications for 2-3 days - CHADS-VASc is 6 was on Eliquis at home, will initiate and defer long-term continuation to PCP and primary cardiologist - TSH is not low -seen by cardiology given Cardizem drip for the first 24 hours, goal is rate controlled, currently on high-dose Toprol-XL, also given 3 doses of IV digoxin on 04/05/2018 with good effect,  if rate becomes challenging with borderline low blood pressures we will initiate digoxin again, we will start her on 81 mg of aspirin.  Hyperkalemia.  Lasix initiated, single dose of Kayexalate and repeat BMP tomorrow.  Acute on Chronic right-sided heart failure due to moderate to severe pulmonary hypertension, chronic combined systolic and diastolic heart failure -  09/2017 TTE noted EF 45-50% -as mostly upper extremity edema (likely oncotic), likely due to combination of severe pulmonary hypertension along with severe chronic hypoalbuminemia due to alcohol abuse and poor oral intake, placed on protein supplementation, counseled to quit alcohol.  Now that blood pressures are better we will initiate diuresis as tolerated.  Hypoglycemia -  No hx of DM - A1c 5.04 October 2017 - likely secondary to alcoholism - had stable random cortisol, TSH, BHOB, appropriately low C peptide, pending proinsulin/insulin, BHOB, and sulfonylurea, currently CBG stable no acute issue.  Alcoholism with early alcohol withdrawal -  Patient acknowledged long hx of excessive daily drinking  counseled to quit, refuses any help or referral to AA, was in early DTs which are much better controlled after the placement of scheduled Librium, CIWA protocol in place.  Moderate PCM with hypoalbuminemia -  albumin 2.6 on admission - secondary to alcoholism with liver disease and poor nutrition, placed on pro-stat.this causes her chronic third spacing and edema.  Occult GI bleeding - brown stool on DRE that is FOBT positive - stable H&H placed on PPI.  Outpatient GI follow-up.  Depression with anxiety -  Continue Cymbalta, Buspar, hold trazodone for  excessive sedation as she is on benzodiazepines to prevent frank DTs.    Abdominal pain - CT negative for acute findings - appears to be a chronic issue based on review of EMR, exam is benign.  COPD - Well compensated at this time.   Borderline elevated TSH  - FT4 is normal - this is sick  euthyroid syndrome.  No further work-up.  PCP to repeat TSH in 4 to 6 weeks outpatient.   DVT prophylaxis: SCDs/Eliquis Code Status: FULL CODE Family Communication: no family present at time of exam  Disposition Plan: Transfer from stepdown to telemetry bed  Consultants:  Cardiology   Anti-infectives (From admission, onward)   Start     Dose/Rate Route Frequency Ordered Stop   04/04/18 0700  vancomycin (VANCOCIN) 1,250 mg in sodium chloride 0.9 % 250 mL IVPB  Status:  Discontinued     1,250 mg 166.7 mL/hr over 90 Minutes Intravenous Every 12 hours 04/03/18 1753 04/03/18 2226   04/03/18 2300  piperacillin-tazobactam (ZOSYN) IVPB 3.375 g  Status:  Discontinued     3.375 g 12.5 mL/hr over 240 Minutes Intravenous Every 8 hours 04/03/18 1753 04/03/18 2226   04/03/18 1615  piperacillin-tazobactam (ZOSYN) IVPB 3.375 g     3.375 g 100 mL/hr over 30 Minutes Intravenous  Once 04/03/18 1611 04/03/18 1818   04/03/18 1615  vancomycin (VANCOCIN) 2,000 mg in sodium chloride 0.9 % 500 mL IVPB     2,000 mg 250 mL/hr over 120 Minutes Intravenous  Once 04/03/18 1611 04/03/18 2247      Objective:  Blood pressure 109/71, pulse 99, temperature 97.6 F (36.4 C), temperature source Oral, resp. rate 10, height 5\' 9"  (1.753 m), weight 93.1 kg, SpO2 95 %.  Examination:  Awake Alert, Oriented X 2, No new F.N deficits, tremors much better, dramatic affect Heard.AT,PERRAL Supple Neck,No JVD, No cervical lymphadenopathy appriciated.  Symmetrical Chest wall movement, Good air movement bilaterally, CTAB RRR,No Gallops, Rubs or new Murmurs, No Parasternal Heave +ve B.Sounds, Abd Soft, No tenderness, No organomegaly appriciated, No rebound - guarding or rigidity. No Cyanosis, Clubbing , extremity 2+ edema lower extremity trace edema wearing TED stockings, No new Rash or bruise    CBC: Recent Labs  Lab 04/03/18 1704 04/04/18 0650 04/05/18 0336 04/06/18 0258  WBC 5.5 5.1 4.9 5.0  NEUTROABS 3.3  --   --    --   HGB 12.3 11.2* 10.7* 11.9*  HCT 37.8 34.0* 33.9* 37.9  MCV 95.9 95.8 98.3 100.5*  PLT 352 338 291 345   Basic Metabolic Panel: Recent Labs  Lab 04/04/18 0650 04/05/18 0336 04/06/18 0258  NA 139 141 140  K 3.4* 4.4 6.0*  CL 100 104 103  CO2 31 27 26   GLUCOSE 82 116* 90  BUN 5* 6 8  CREATININE 0.75 0.77 0.80  CALCIUM 7.5* 8.0* 8.4*  MG 1.5* 2.0 1.8  PHOS  --  4.0  --    GFR: Estimated Creatinine Clearance: 93.2 mL/min (by C-G formula based on SCr of 0.8 mg/dL).  Liver Function Tests: Recent Labs  Lab 04/03/18 1704 04/04/18 0650 04/05/18 0336 04/06/18 0258  AST 167* 106* 109* 327*  ALT 52* 41 39 69*  ALKPHOS 143* 123 112 136*  BILITOT 1.6* 1.4* 1.0 1.4*  PROT 5.3* 4.7* 4.8* 4.9*  ALBUMIN 2.6* 2.2* 2.2* 2.4*   Recent Labs  Lab 04/03/18 1704  LIPASE 27   Lab Results  Component Value Date   TSH 4.803 (H) 04/03/2018  Cardiac Enzymes: Recent Labs  Lab 04/03/18 1704  TROPONINI <0.03    HbA1C: Hgb A1c MFr Bld  Date/Time Value Ref Range Status  04/03/2018 11:49 PM 5.2 4.8 - 5.6 % Final    Comment:    (NOTE) Pre diabetes:          5.7%-6.4% Diabetes:              >6.4% Glycemic control for   <7.0% adults with diabetes     CBG: Recent Labs  Lab 04/05/18 1146 04/05/18 1641 04/05/18 1942 04/06/18 0511 04/06/18 0750  GLUCAP 101* 93 86 91 99    Recent Results (from the past 240 hour(s))  Blood culture (routine x 2)     Status: None (Preliminary result)   Collection Time: 04/03/18  4:12 PM  Result Value Ref Range Status   Specimen Description BLOOD RIGHT ARM  Final   Special Requests   Final    BOTTLES DRAWN AEROBIC AND ANAEROBIC Blood Culture adequate volume   Culture   Final    NO GROWTH 3 DAYS Performed at [facility], [address] [place], [place] [address]    Report Status PENDING  Incomplete  MRSA PCR Screening     Status: None   Collection Time: 04/03/18 11:59 PM  Result Value Ref Range Status   MRSA by PCR  NEGATIVE NEGATIVE Final    Comment:        The GeneXpert MRSA Assay (FDA approved for NASAL specimens only), is one component of a comprehensive MRSA colonization surveillance program. It is not intended to diagnose MRSA infection nor to guide or monitor treatment for MRSA infections. Performed at [facility], [address], [place], [place] [address]   C difficile quick scan w PCR reflex     Status: None   Collection Time: 04/05/18 11:23 PM  Result Value Ref Range Status   C Diff antigen NEGATIVE NEGATIVE Final   C Diff toxin NEGATIVE NEGATIVE Final   C Diff interpretation No C. difficile detected.  Final    Comment: Performed at [facility], [address] [place], [place] [address]     Scheduled Meds: . busPIRone  30 mg Oral BID  . chlordiazePOXIDE  10 mg Oral TID  . DULoxetine  60 mg Oral Daily  . feeding supplement (ENSURE ENLIVE)  237 mL Oral BID BM  . feeding supplement (PRO-STAT SUGAR FREE 64)  30 mL Oral TID WC  . folic acid  1 mg Oral Daily  . furosemide  40 mg Intravenous Q12H  . gabapentin  600 mg Oral TID  . LORazepam  0-4 mg Oral Q12H  . magnesium oxide  400 mg Oral Daily  . metoprolol succinate  100 mg Oral Daily  . multivitamin with minerals  1 tablet Oral Daily  . pantoprazole  40 mg Oral BID AC  . roflumilast  500 mcg Oral Daily  . sodium chloride flush  3 mL Intravenous Q12H  . vitamin B-1  250 mg Oral Daily  . vitamin B-12  500 mcg Oral Daily   Continuous Infusions:    LOS: 2 days   Signature  Susa Raring M.D on 04/06/2018 at 10:34 AM  To page go to [email or web address] - password [facility]

## 2018-04-06 NOTE — Progress Notes (Signed)
I spoke with Laurie Lynch earlier and instructed patient to limit po intake to just wet her lips and take small sips with meds D/T such extreme edema, IVF's D/C'd D/T extreme edema, will continue to monitor.

## 2018-04-07 LAB — GLUCOSE, CAPILLARY
GLUCOSE-CAPILLARY: 78 mg/dL (ref 70–99)
GLUCOSE-CAPILLARY: 86 mg/dL (ref 70–99)
Glucose-Capillary: 76 mg/dL (ref 70–99)
Glucose-Capillary: 79 mg/dL (ref 70–99)
Glucose-Capillary: 86 mg/dL (ref 70–99)
Glucose-Capillary: 78 mg/dL (ref 70–99)

## 2018-04-07 LAB — COMPREHENSIVE METABOLIC PANEL
ALK PHOS: 147 U/L — AB (ref 38–126)
ALT: 70 U/L — AB (ref 0–44)
AST: 228 U/L — ABNORMAL HIGH (ref 15–41)
Albumin: 2.5 g/dL — ABNORMAL LOW (ref 3.5–5.0)
Anion gap: 12 (ref 5–15)
BUN: 9 mg/dL (ref 6–20)
CALCIUM: 8.4 mg/dL — AB (ref 8.9–10.3)
CO2: 32 mmol/L (ref 22–32)
CREATININE: 0.77 mg/dL (ref 0.44–1.00)
Chloride: 98 mmol/L (ref 98–111)
Glucose, Bld: 72 mg/dL (ref 70–99)
Potassium: 3.9 mmol/L (ref 3.5–5.1)
Sodium: 142 mmol/L (ref 135–145)
TOTAL PROTEIN: 5.6 g/dL — AB (ref 6.5–8.1)
Total Bilirubin: 1 mg/dL (ref 0.3–1.2)

## 2018-04-07 LAB — MAGNESIUM: MAGNESIUM: 1.3 mg/dL — AB (ref 1.7–2.4)

## 2018-04-07 MED ORDER — LORAZEPAM 1 MG PO TABS
1.0000 mg | ORAL_TABLET | Freq: Once | ORAL | Status: AC
  Administered 2018-04-07: 1 mg via ORAL
  Filled 2018-04-07: qty 1

## 2018-04-07 MED ORDER — MAGNESIUM SULFATE 2 GM/50ML IV SOLN
2.0000 g | Freq: Once | INTRAVENOUS | Status: AC
  Administered 2018-04-07: 2 g via INTRAVENOUS
  Filled 2018-04-07: qty 50

## 2018-04-07 NOTE — Evaluation (Addendum)
Occupational Therapy Evaluation Patient Details Name: Laurie Lynch Treat MRN: [id number] DOB: [birth year] Today's Date: 04/07/2018    History of Present Illness Pt presented with abdominal pain and melena. Pt found to have A-fib with RVR and acute on chronic rt side heart failure. Pt with early signs of ETOH withdrawal and placed on CIWA protocol.  Pt with recent admit to [facility] with AKI and alcohol related problems. PMH - ETOH abuse, depression, copd   Clinical Impression   PTA patient reports independent with ADLs, IADLs and mobility (not driving).  Currently requires min assist for UB ADL, mod assist for LB ADL, min assist for toileting, min assist for toilet transfers, and supervision for bed mobility.  Significantly limited during session from pain (headache, R UE), B UE tremors and pitting edema, tachycardia (with nursing present and aware), decreased safety awareness, and impaired balance.  Based on performance today, patient will benefit from continued OT services while admitted and continued SNF level rehab at discharge.  Noted some deficits due to withdrawal, therefore will continue to follow and update recommendations as patient progresses.  Spoke to RN and recommended assistance for patient up to 3:1 commode at this time. Will continue to follow.     Follow Up Recommendations  SNF;Supervision/Assistance - 24 hour(may progress to home health )    Equipment Recommendations  None recommended by OT    Recommendations for Other Services PT consult     Precautions / Restrictions Precautions Precautions: Fall;Other (comment) Precaution Comments: tachycardia- watch HR Restrictions Weight Bearing Restrictions: No      Mobility Bed Mobility Overal bed mobility: Needs Assistance Bed Mobility: Supine to Sit     Supine to sit: Supervision     General bed mobility comments: increased time required  Transfers Overall transfer level: Needs assistance   Transfers: Sit to/from  Stand;Stand Pivot Transfers Sit to Stand: Min assist Stand pivot transfers: Min assist       General transfer comment: min assist for safety and balance    Balance Overall balance assessment: Mild deficits observed, not formally tested                                         ADL either performed or assessed with clinical judgement   ADL Overall ADL's : Needs assistance/impaired Eating/Feeding: Minimal assistance Eating/Feeding Details (indicate cue type and reason): min assist to steady cup when drinking water Grooming: Minimal assistance;Sitting   Upper Body Bathing: Minimal assistance;Sitting   Lower Body Bathing: Sit to/from stand;Moderate assistance Lower Body Bathing Details (indicate cue type and reason): decreased reach to B feet, impaired balance and safety Upper Body Dressing : Minimal assistance;Sitting   Lower Body Dressing: Moderate assistance;Sit to/from stand Lower Body Dressing Details (indicate cue type and reason): decreased reach and impaired balance, tremors limiting Toilet Transfer: Minimal assistance;BSC;Stand-pivot Toilet Transfer Details (indicate cue type and reason): cueing for safety and technique Toileting- Clothing Manipulation and Hygiene: Minimal assistance;Sitting/lateral lean;Sit to/from stand Toileting - Clothing Manipulation Details (indicate cue type and reason): cueing for safety, min assist for hygiene and balance     Functional mobility during ADLs: Minimal assistance(stand pivot only) General ADL Comments: Pt significantly limited by generalized weakness, decreased activity tolerance, edema and pain.  Noted HR tachycardic while sitting on commode, max 211, with RN and PT present during episode.      Vision   Vision Assessment?: No apparent visual deficits  Perception     Praxis      Pertinent Vitals/Pain Pain Assessment: Faces Faces Pain Scale: Hurts even more Pain Location: headache, R am Pain Descriptors /  Indicators: Aching;Discomfort;Grimacing;Guarding Pain Intervention(s): Limited activity within patient's tolerance;Repositioned     Hand Dominance Right   Extremity/Trunk Assessment Upper Extremity Assessment Upper Extremity Assessment: RUE deficits/detail;LUE deficits/detail RUE Deficits / Details: generalized weakness, edema and tremors RUE Coordination: decreased fine motor;decreased gross motor LUE Deficits / Details: generalized weakness, edema and tremors LUE Coordination: decreased fine motor;decreased gross motor   Lower Extremity Assessment Lower Extremity Assessment: Defer to PT evaluation       Communication Communication Communication: No difficulties   Cognition Arousal/Alertness: Awake/alert Behavior During Therapy: Flat affect Overall Cognitive Status: No family/caregiver present to determine baseline cognitive functioning Area of Impairment: Attention;Safety/judgement;Awareness;Problem solving                   Current Attention Level: Selective     Safety/Judgement: Decreased awareness of deficits;Decreased awareness of safety Awareness: Emergent Problem Solving: Slow processing;Decreased initiation;Requires verbal cues General Comments: noted withdrawling, requires increased time    General Comments       Exercises     Shoulder Instructions      Home Living Family/patient expects to be discharged to:: Private residence Living Arrangements: Alone Available Help at Discharge: Friend(s);Available PRN/intermittently Type of Home: Apartment Home Access: Stairs to enter Entrance Stairs-Number of Steps: 18 Entrance Stairs-Rails: Right;Left;Can reach both Home Layout: One level     Bathroom Shower/Tub: Chief Strategy Officer: Standard     Home Equipment: Environmental consultant - 2 wheels;Cane - single point;Bedside commode;Shower seat;Grab bars - tub/shower          Prior Functioning/Environment Level of Independence: Independent         Comments: independent with ADLs, IADls and mobility (does not drive)        OT Problem List: Decreased strength;Decreased activity tolerance;Impaired balance (sitting and/or standing);Decreased safety awareness;Decreased knowledge of use of DME or AE;Decreased knowledge of precautions;Cardiopulmonary status limiting activity;Pain;Increased edema      OT Treatment/Interventions: Self-care/ADL training;Therapeutic exercise;Energy conservation;DME and/or AE instruction;Therapeutic activities;Patient/family education;Balance training    OT Goals(Current goals can be found in the care plan section) Acute Rehab OT Goals Patient Stated Goal: none stated OT Goal Formulation: With patient Time For Goal Achievement: 04/21/18 Potential to Achieve Goals: Good  OT Frequency: Min 2X/week   Barriers to D/C:            Co-evaluation              AM-PAC PT "6 Clicks" Daily Activity     Outcome Measure Help from another person eating meals?: A Little Help from another person taking care of personal grooming?: A Little Help from another person toileting, which includes using toliet, bedpan, or urinal?: A Lot Help from another person bathing (including washing, rinsing, drying)?: A Lot Help from another person to put on and taking off regular upper body clothing?: A Little Help from another person to put on and taking off regular lower body clothing?: A Lot 6 Click Score: 15   End of Session Nurse Communication: Mobility status;Precautions  Activity Tolerance: Treatment limited secondary to medical complications (Comment)(tachycardia) Patient left: with call bell/phone within reach;Other (comment)(seated EOB hand off to PT)  OT Visit Diagnosis: Other abnormalities of gait and mobility (R26.89);Muscle weakness (generalized) (M62.81);Pain Pain - Right/Left: Right Pain - part of body: Arm(head)  Time: 4270-6237 OT Time Calculation (min): 17 min Charges:  OT General  Charges $OT Visit: 1 Visit OT Evaluation $OT Eval Moderate Complexity: 1 Mod  Chancy Milroy, OT Acute Rehabilitation Services Pager [phone] Office [phone]   Chancy Milroy 04/07/2018, 4:59 PM

## 2018-04-07 NOTE — Evaluation (Signed)
Physical Therapy Evaluation Patient Details Name: Laurie Lynch MRN: [id number] DOB: April 19, 1961 Today's Date: 04/07/2018   History of Present Illness  Pt presented with abdominal pain and melena. Pt found to have A-fib with RVR and acute on chronic rt side heart failure. Pt with early signs of ETOH withdrawal and placed on CIWA protocol.  Pt with recent admit to [facility] with AKI and alcohol related problems. PMH - ETOH abuse, depression, copd  Clinical Impression  Pt presents to PT with unsteady gait and activity limited by high HR (up to 211 when on bsc). Pt lives alone and currently feel pt needs SNF. However pt may progress quickly with mobility if withdrawal symptoms resolve.     Follow Up Recommendations SNF(pt may progress quickly and not need this by DC)    Equipment Recommendations  None recommended by PT    Recommendations for Other Services       Precautions / Restrictions Precautions Precautions: Fall;Other (comment) Precaution Comments: tachycardia- watch HR Restrictions Weight Bearing Restrictions: No      Mobility  Bed Mobility Overal bed mobility: Needs Assistance Bed Mobility: Sit to Supine     Supine to sit: Supervision Sit to supine: Supervision   General bed mobility comments: Incr time  Transfers Overall transfer level: Needs assistance Equipment used: None Transfers: Sit to/from [employer] Sit to Stand: Min assist Stand pivot transfers: Min assist       General transfer comment: Assist for safety and balance.  Ambulation/Gait Ambulation/Gait assistance: Min assist Gait Distance (Feet): 25 Feet Assistive device: None Gait Pattern/deviations: Step-through pattern;Decreased stride length Gait velocity: decr Gait velocity interpretation: 1.31 - 2.62 ft/sec, indicative of limited community ambulator General Gait Details: Assist for balance. Unsteady gait. Distance limited due to pt with high HR  Stairs             Wheelchair Mobility    Modified Rankin (Stroke Patients Only)       Balance Overall balance assessment: Mild deficits observed, not formally tested                                           Pertinent Vitals/Pain Pain Assessment: Faces Faces Pain Scale: Hurts even more Pain Location: headache, R am Pain Descriptors / Indicators: Aching;Discomfort;Grimacing;Guarding Pain Intervention(s): Limited activity within patient's tolerance;Monitored during session    Home Living Family/patient expects to be discharged to:: Private residence Living Arrangements: Alone Available Help at Discharge: Friend(s);Available PRN/intermittently Type of Home: Apartment Home Access: Stairs to enter Entrance Stairs-Rails: Right;Left;Can reach both Entrance Stairs-Number of Steps: 18 Home Layout: One level Home Equipment: Walker - 2 wheels;Cane - single point;Bedside commode;Shower seat;Grab bars - tub/shower      Prior Function Level of Independence: Independent         Comments: independent with ADLs, IADls and mobility (does not drive)     Hand Dominance   Dominant Hand: Right    Extremity/Trunk Assessment   Upper Extremity Assessment Upper Extremity Assessment: Defer to OT evaluation RUE Deficits / Details: generalized weakness, edema and tremors RUE Coordination: decreased fine motor;decreased gross motor LUE Deficits / Details: generalized weakness, edema and tremors LUE Coordination: decreased fine motor;decreased gross motor    Lower Extremity Assessment Lower Extremity Assessment: Generalized weakness       Communication   Communication: No difficulties  Cognition Arousal/Alertness: Awake/alert Behavior During Therapy: Flat affect Overall Cognitive Status:  No family/caregiver present to determine baseline cognitive functioning Area of Impairment: Attention;Safety/judgement;Awareness;Problem solving                   Current Attention  Level: Selective     Safety/Judgement: Decreased awareness of deficits;Decreased awareness of safety Awareness: Emergent Problem Solving: Slow processing;Decreased initiation;Requires verbal cues General Comments: ETOH withdrawal      General Comments      Exercises     Assessment/Plan    PT Assessment Patient needs continued PT services  PT Problem List Decreased strength;Decreased activity tolerance;Decreased balance;Decreased mobility;Decreased cognition;Decreased safety awareness;Cardiopulmonary status limiting activity       PT Treatment Interventions DME instruction;Gait training;Stair training;Functional mobility training;Therapeutic activities;Therapeutic exercise;Balance training;Patient/family education    PT Goals (Current goals can be found in the Care Plan section)  Acute Rehab PT Goals Patient Stated Goal: none stated PT Goal Formulation: With patient Time For Goal Achievement: 04/14/18 Potential to Achieve Goals: Good    Frequency Min 3X/week   Barriers to discharge Inaccessible home environment;Decreased caregiver support Lives alone in 2nd story apartment    Co-evaluation               AM-PAC PT "6 Clicks" Daily Activity  Outcome Measure Difficulty turning over in bed (including adjusting bedclothes, sheets and blankets)?: None Difficulty moving from lying on back to sitting on the side of the bed? : None Difficulty sitting down on and standing up from a chair with arms (e.g., wheelchair, bedside commode, etc,.)?: Unable Help needed moving to and from a bed to chair (including a wheelchair)?: A Little Help needed walking in hospital room?: A Little Help needed climbing 3-5 steps with a railing? : A Little 6 Click Score: 18    End of Session   Activity Tolerance: Treatment limited secondary to medical complications (Comment)(High HR) Patient left: in bed;with call bell/phone within reach;with bed alarm set Nurse Communication: Mobility  status PT Visit Diagnosis: Unsteadiness on feet (R26.81);Other abnormalities of gait and mobility (R26.89);Muscle weakness (generalized) (M62.81)    Time: 1610-9604 PT Time Calculation (min) (ACUTE ONLY): 8 min   Charges:   PT Evaluation $PT Eval Moderate Complexity: 1 Mod          [facility] PT Acute Rehabilitation Services Pager [phone] Office [phone]   Angelina Ok [facility] 04/07/2018, 6:02 PM

## 2018-04-07 NOTE — Progress Notes (Signed)
Progress Note  Patient Name: Laurie Lynch Date of Encounter: 04/07/2018  Primary Cardiologist:   No primary care provider on file.   Subjective   Sore all over.  No acute SOB or chest pain.    Inpatient Medications    Scheduled Meds: . apixaban  5 mg Oral BID  . busPIRone  30 mg Oral BID  . chlordiazePOXIDE  10 mg Oral TID  . DULoxetine  60 mg Oral Daily  . feeding supplement (ENSURE ENLIVE)  237 mL Oral BID BM  . feeding supplement (PRO-STAT SUGAR FREE 64)  30 mL Oral TID WC  . folic acid  1 mg Oral Daily  . furosemide  40 mg Intravenous Q12H  . gabapentin  600 mg Oral TID  . LORazepam  0-4 mg Oral Q12H  . magnesium oxide  400 mg Oral Daily  . metoprolol succinate  100 mg Oral Daily  . multivitamin with minerals  1 tablet Oral Daily  . pantoprazole  40 mg Oral BID AC  . roflumilast  500 mcg Oral Daily  . sodium chloride flush  3 mL Intravenous Q12H  . vitamin B-1  250 mg Oral Daily  . vitamin B-12  500 mcg Oral Daily   Continuous Infusions:  PRN Meds: acetaminophen **OR** acetaminophen, albuterol, diltiazem, HYDROcodone-acetaminophen, metoprolol tartrate, naphazoline-glycerin, [DISCONTINUED] ondansetron **OR** ondansetron (ZOFRAN) IV, senna-docusate   Vital Signs    Vitals:   04/06/18 2345 04/07/18 0342 04/07/18 0605 04/07/18 0857  BP: 127/86 129/87 (!) 141/96 (!) 126/102  Pulse: 96 (!) 110  (!) 125  Resp: 15 13 11    Temp: 98.2 F (36.8 C) (!) 97.5 F (36.4 C) 98.2 F (36.8 C)   TempSrc: Oral Oral Oral   SpO2: 94% 94% 94%   Weight:   89.9 kg   Height:        Intake/Output Summary (Last 24 hours) at 04/07/2018 0947 Last data filed at 04/07/2018 0636 Gross per 24 hour  Intake 843 ml  Output 2700 ml  Net -1857 ml   Filed Weights   04/05/18 0620 04/06/18 0602 04/07/18 0605  Weight: 91.5 kg 93.1 kg 89.9 kg    Telemetry    Atrial fib with rate relatively controlled - Personally Reviewed  ECG    NA - Personally Reviewed  Physical Exam   GEN: No  acute distress.   Neck: No  JVD Cardiac: Irregular RR, no murmurs, rubs, or gallops.  Respiratory: Clear to auscultation bilaterally. GI: Soft, nontender, non-distended  MS: No edema; No deformity. Neuro:  Nonfocal  Psych: Normal affect   Labs    Chemistry Recent Labs  Lab 04/05/18 0336 04/06/18 0258 04/07/18 0712  NA 141 140 142  K 4.4 6.0* 3.9  CL 104 103 98  CO2 27 26 32  GLUCOSE 116* 90 72  BUN 6 8 9   CREATININE 0.77 0.80 0.77  CALCIUM 8.0* 8.4* 8.4*  PROT 4.8* 4.9* 5.6*  ALBUMIN 2.2* 2.4* 2.5*  AST 109* 327* 228*  ALT 39 69* 70*  ALKPHOS 112 136* 147*  BILITOT 1.0 1.4* 1.0  GFRNONAA >60 >60 >60  GFRAA >60 >60 >60  ANIONGAP 10 11 12      Hematology Recent Labs  Lab 04/04/18 0650 04/05/18 0336 04/06/18 0258  WBC 5.1 4.9 5.0  RBC 3.55* 3.45* 3.77*  HGB 11.2* 10.7* 11.9*  HCT 34.0* 33.9* 37.9  MCV 95.8 98.3 100.5*  MCH 31.5 31.0 31.6  MCHC 32.9 31.6 31.4  RDW 16.0* 16.8* 16.5*  PLT 338  291 345    Cardiac Enzymes Recent Labs  Lab 04/03/18 1704  TROPONINI <0.03    Recent Labs  Lab 04/03/18 1704  TROPIPOC 0.02     BNP Recent Labs  Lab 04/03/18 1705  BNP 466.4*     DDimer No results for input(s): DDIMER in the last 168 hours.   Radiology    No results found.  Cardiac Studies   TTE 10/03/2017 There is normal left ventricular wall thickness. The left ventricular ejection fraction is mildly reduced (45-50%). Septal motion is consistent with conduction abnormality. The aortic valve is trileaflet. Unable to adequately determine diastolic dysfunction. The mitral valve leaflets appear thickened, but open well. There is mild-moderate (1-2+) mitral regurgitation. There is moderate (2+) tricuspid regurgitation. Right ventricular systolic pressure is elevated between 50-72mm Hg, consistent with moderately severe pulmonary hypertension. The left atrium is moderately dilated. The right atrium is mildly dilated.  Patient Profile     57 y.o.  female with a PMH ofchronic atrial fibrillation on Eliquis,noncompliance, alcoholism on CIWA protocol, ?HFrEF (09/2017 EF 45-50%), frequent falls, COPD (prescribed home oxygen), depression and anxiety, who is being followed by cardiology for afib RVR.  Assessment & Plan    ATRIAL FIB:    She is on Eliquis although I am not convinced that she is a good long term anticoagulation candidate.  This was on her home list but compliance was unlikely.  Rate is better.   Continue current therapy.   ACUTE ON CHRONIC COMBINED SYSTOLIC AND DIASTOLIC HF:  On IV Lasix with good UO.  Continue today.    HTN:   BP is very labile.  I will not titrate meds today but would like to increase the beta blocker again before discharge.    For questions or updates, please contact [facility] Please consult [email or web address] for contact info under Cardiology/STEMI.   Signed, Rollene Rotunda, MD  04/07/2018, 9:47 AM

## 2018-04-07 NOTE — Progress Notes (Signed)
Laurie Lynch  ZOX:[id number] DOB: January 29, 1961 DOA: 04/03/2018 PCP: Madison Hickman, MD    Brief Narrative:  57 y.o. female with a hx of chronic atrial fibrillation on Eliquis, depression with anxiety, COPD, and alcoholism who presented to the ED w/ periumbilical abdominal pain and melena. Patient was admitted to a [facility] hospital late last month and discharged several days prior to this admit after treatment for acute kidney injury and alcohol-related complications. She was advised to stop Lasix and reduce diltiazem due to low BP's, and to stop Eliquis given recurrent falls and ongoing alcoholism. After returning home she noted general malaise with poor appetite, tenderness in the periumbilical region, and black tarry stools.  She admitted to ongoing daily alcohol use.  She called EMS, was found to be in atrial fibrillation with rapid rate, given a dose of Lopressor and fentanyl, and brought to the ED. In the ED EKG featured atrial fibrillation w/ RVR. CT abd/pelvis was negative for acute findings but notable for severe hepatic steatosis.  CBC was unremarkable.  DRE revealed brown stool that was FOBT positive.    Subjective: Patient with numerous complaints this morning including abdominal pain, shortness of breath, fatigue, headache, generalized body aches.     Assessment & Plan:  Chronic Atrial fibrillation with acute RVR She apparently had been noncompliant with her medications.  CHADS-VASc is 6 was on Eliquis at home.  Compliance is unclear.  This has been reinitiated.  Does not appear to be a good long-term candidate for anticoagulation due to compliance issues as well as alcoholism.  Will defer to her primary care provider. TSH is not low.  She has not seen by cardiology.  Patient is currently on beta-blocker.  She was given intravenous digoxin.  Rate is better but still not adequately controlled.  Cardiology continues to follow.  Hyperkalemia/hypomagnesemia Level improved this morning.   Magnesium is noted to be low and will be repleted.    Acute on Chronic right-sided heart failure due to moderate to severe pulmonary hypertension, chronic combined systolic and diastolic heart failure 09/2017 TTE noted EF 45-50% -as mostly upper extremity edema (likely oncotic), likely due to combination of severe pulmonary hypertension along with severe chronic hypoalbuminemia due to alcohol abuse and poor oral intake, placed on protein supplementation, counseled to quit alcohol.  Patient placed on IV Lasix.  Has diuresed well over the last 24 hours.  Continue to monitor.  Hypoglycemia No hx of DM - A1c 5.04 October 2017.  This is most likely secondary to alcoholism. Had stable random cortisol, TSH, BHOB, appropriately low C peptide, pending proinsulin/insulin, and sulfonylurea.  CBGs have been stable.  Continue to watch closely.  Encourage oral intake.  Alcoholism with early alcohol withdrawal/abnormal LFTs Patient acknowledged long hx of excessive daily drinking  counseled to quit, refuses any help or referral to AA, was in early DTs which are much better controlled after the placement of scheduled Librium, CIWA protocol in place.  Abnormal LFTs are due to the same.  Moderate PCM with hypoalbuminemia Albumin 2.6 on admission - secondary to alcoholism with liver disease and poor nutrition, placed on pro-stat and ensure.   Occult GI bleeding Brown stool on DRE that is FOBT positive.  Hemoglobin has been stable.  Needs outpatient GI follow-up.  Would be hesitant to continue long-term anticoagulation for this reason as well.  Depression with anxiety Continue Cymbalta, Buspar, hold trazodone for excessive sedation as she is on benzodiazepines to prevent frank DTs.    Abdominal pain  CT negative for acute findings - appears to be a chronic issue based on review of EMR, exam is benign.  COPD Well compensated at this time.   Borderline elevated TSH FT4 is normal - this is sick euthyroid  syndrome.  No further work-up.  PCP to repeat TSH in 4 to 6 weeks outpatient.   DVT prophylaxis: SCDs/Eliquis Code Status: FULL CODE Family Communication: Discussed with patient Disposition Plan: Mobilize.  PT and OT evaluation.  Consultants:  Cardiology   Anti-infectives (From admission, onward)   Start     Dose/Rate Route Frequency Ordered Stop   04/04/18 0700  vancomycin (VANCOCIN) 1,250 mg in sodium chloride 0.9 % 250 mL IVPB  Status:  Discontinued     1,250 mg 166.7 mL/hr over 90 Minutes Intravenous Every 12 hours 04/03/18 1753 04/03/18 2226   04/03/18 2300  piperacillin-tazobactam (ZOSYN) IVPB 3.375 g  Status:  Discontinued     3.375 g 12.5 mL/hr over 240 Minutes Intravenous Every 8 hours 04/03/18 1753 04/03/18 2226   04/03/18 1615  piperacillin-tazobactam (ZOSYN) IVPB 3.375 g     3.375 g 100 mL/hr over 30 Minutes Intravenous  Once 04/03/18 1611 04/03/18 1818   04/03/18 1615  vancomycin (VANCOCIN) 2,000 mg in sodium chloride 0.9 % 500 mL IVPB     2,000 mg 250 mL/hr over 120 Minutes Intravenous  Once 04/03/18 1611 04/03/18 2247      Objective:  Blood pressure 109/71, pulse 99, temperature 97.6 F (36.4 C), temperature source Oral, resp. rate 10, height 5\' 9"  (1.753 m), weight 93.1 kg, SpO2 95 %.  Examination:  Awake alert.  In no distress.  Fatigued. Clear to auscultation bilaterally.  No wheezing rales or rhonchi S1-S2 is irregularly irregular.  No S3-S4. Abdomen is soft.  Weak tenderness diffusely without any rebound rigidity or guarding.  No masses organomegaly Pitting edema noted in all extremities lower and upper. No obvious focal neurological deficits.    CBC: Recent Labs  Lab 04/03/18 1704 04/04/18 0650 04/05/18 0336 04/06/18 0258  WBC 5.5 5.1 4.9 5.0  NEUTROABS 3.3  --   --   --   HGB 12.3 11.2* 10.7* 11.9*  HCT 37.8 34.0* 33.9* 37.9  MCV 95.9 95.8 98.3 100.5*  PLT 352 338 291 345   Basic Metabolic Panel: Recent Labs  Lab 04/05/18 0336  04/06/18 0258 04/07/18 0712  NA 141 140 142  K 4.4 6.0* 3.9  CL 104 103 98  CO2 27 26 32  GLUCOSE 116* 90 72  BUN 6 8 9   CREATININE 0.77 0.80 0.77  CALCIUM 8.0* 8.4* 8.4*  MG 2.0 1.8 1.3*  PHOS 4.0  --   --    GFR: Estimated Creatinine Clearance: 91.6 mL/min (by C-G formula based on SCr of 0.77 mg/dL).  Liver Function Tests: Recent Labs  Lab 04/04/18 0650 04/05/18 0336 04/06/18 0258 04/07/18 0712  AST 106* 109* 327* 228*  ALT 41 39 69* 70*  ALKPHOS 123 112 136* 147*  BILITOT 1.4* 1.0 1.4* 1.0  PROT 4.7* 4.8* 4.9* 5.6*  ALBUMIN 2.2* 2.2* 2.4* 2.5*   Recent Labs  Lab 04/03/18 1704  LIPASE 27   Lab Results  Component Value Date   TSH 4.803 (H) 04/03/2018     Cardiac Enzymes: Recent Labs  Lab 04/03/18 1704  TROPONINI <0.03    HbA1C: Hgb A1c MFr Bld  Date/Time Value Ref Range Status  04/03/2018 11:49 PM 5.2 4.8 - 5.6 % Final    Comment:    (NOTE) Pre  diabetes:          5.7%-6.4% Diabetes:              >6.4% Glycemic control for   <7.0% adults with diabetes     CBG: Recent Labs  Lab 04/06/18 2010 04/07/18 0012 04/07/18 0421 04/07/18 0738 04/07/18 1132  GLUCAP 77 78 76 79 86    Recent Results (from the past 240 hour(s))  Blood culture (routine x 2)     Status: None (Preliminary result)   Collection Time: 04/03/18  4:12 PM  Result Value Ref Range Status   Specimen Description BLOOD RIGHT ARM  Final   Special Requests   Final    BOTTLES DRAWN AEROBIC AND ANAEROBIC Blood Culture adequate volume   Culture   Final    NO GROWTH 3 DAYS Performed at [facility], [address] [place], [place] [address]    Report Status PENDING  Incomplete  MRSA PCR Screening     Status: None   Collection Time: 04/03/18 11:59 PM  Result Value Ref Range Status   MRSA by PCR NEGATIVE NEGATIVE Final    Comment:        The GeneXpert MRSA Assay (FDA approved for NASAL specimens only), is one component of a comprehensive MRSA colonization surveillance  program. It is not intended to diagnose MRSA infection nor to guide or monitor treatment for MRSA infections. Performed at [facility], [address], [place], [place] [address]   C difficile quick scan w PCR reflex     Status: None   Collection Time: 04/05/18 11:23 PM  Result Value Ref Range Status   C Diff antigen NEGATIVE NEGATIVE Final   C Diff toxin NEGATIVE NEGATIVE Final   C Diff interpretation No C. difficile detected.  Final    Comment: Performed at [facility], [address], [place], [place] [address]     Scheduled Meds: . apixaban  5 mg Oral BID  . busPIRone  30 mg Oral BID  . chlordiazePOXIDE  10 mg Oral TID  . DULoxetine  60 mg Oral Daily  . feeding supplement (ENSURE ENLIVE)  237 mL Oral BID BM  . feeding supplement (PRO-STAT SUGAR FREE 64)  30 mL Oral TID WC  . folic acid  1 mg Oral Daily  . furosemide  40 mg Intravenous Q12H  . gabapentin  600 mg Oral TID  . LORazepam  0-4 mg Oral Q12H  . magnesium oxide  400 mg Oral Daily  . metoprolol succinate  100 mg Oral Daily  . multivitamin with minerals  1 tablet Oral Daily  . pantoprazole  40 mg Oral BID AC  . roflumilast  500 mcg Oral Daily  . sodium chloride flush  3 mL Intravenous Q12H  . vitamin B-1  250 mg Oral Daily  . vitamin B-12  500 mcg Oral Daily   Continuous Infusions:    LOS: 3 days   Signature  Osvaldo Shipper M.D on 04/07/2018 at 12:01 PM  To page go to [email or web address] - password [facility]

## 2018-04-08 LAB — COMPREHENSIVE METABOLIC PANEL
ALBUMIN: 2.5 g/dL — AB (ref 3.5–5.0)
ALT: 50 U/L — AB (ref 0–44)
ANION GAP: 13 (ref 5–15)
AST: 131 U/L — AB (ref 15–41)
Alkaline Phosphatase: 134 U/L — ABNORMAL HIGH (ref 38–126)
BILIRUBIN TOTAL: 1.1 mg/dL (ref 0.3–1.2)
BUN: 8 mg/dL (ref 6–20)
CHLORIDE: 96 mmol/L — AB (ref 98–111)
CO2: 33 mmol/L — ABNORMAL HIGH (ref 22–32)
Calcium: 8.4 mg/dL — ABNORMAL LOW (ref 8.9–10.3)
Creatinine, Ser: 0.86 mg/dL (ref 0.44–1.00)
GFR calc Af Amer: >60 mL/min (ref >60)
GFR calc non Af Amer: >60 mL/min (ref >60)
GLUCOSE: 86 mg/dL (ref 70–99)
POTASSIUM: 3.4 mmol/L — AB (ref 3.5–5.1)
Sodium: 142 mmol/L (ref 135–145)
Total Protein: 5.4 g/dL — ABNORMAL LOW (ref 6.5–8.1)

## 2018-04-08 LAB — CULTURE, BLOOD (ROUTINE X 2)
CULTURE: NO GROWTH
Special Requests: ADEQUATE

## 2018-04-08 LAB — GLUCOSE, CAPILLARY
GLUCOSE-CAPILLARY: 83 mg/dL (ref 70–99)
GLUCOSE-CAPILLARY: 89 mg/dL (ref 70–99)
GLUCOSE-CAPILLARY: 91 mg/dL (ref 70–99)
Glucose-Capillary: 92 mg/dL (ref 70–99)

## 2018-04-08 LAB — CBC
HEMATOCRIT: 39.3 % (ref 36.0–46.0)
Hemoglobin: 12.9 g/dL (ref 12.0–15.0)
MCH: 31.5 pg (ref 26.0–34.0)
MCHC: 32.8 g/dL (ref 30.0–36.0)
MCV: 96.1 fL (ref 78.0–100.0)
PLATELETS: 255 10*3/uL (ref 150–400)
RBC: 4.09 MIL/uL (ref 3.87–5.11)
RDW: 16.2 % — AB (ref 11.5–15.5)
WBC: 6.5 10*3/uL (ref 4.0–10.5)

## 2018-04-08 LAB — MAGNESIUM: MAGNESIUM: 1.5 mg/dL — AB (ref 1.7–2.4)

## 2018-04-08 MED ORDER — METOPROLOL SUCCINATE ER 50 MG PO TB24
150.0000 mg | ORAL_TABLET | Freq: Every day | ORAL | Status: DC
  Administered 2018-04-08 – 2018-04-10 (×3): 150 mg via ORAL
  Filled 2018-04-08 (×3): qty 1

## 2018-04-08 MED ORDER — MAGNESIUM SULFATE 2 GM/50ML IV SOLN
2.0000 g | Freq: Once | INTRAVENOUS | Status: AC
  Administered 2018-04-08: 2 g via INTRAVENOUS
  Filled 2018-04-08: qty 50

## 2018-04-08 MED ORDER — LORAZEPAM 1 MG PO TABS
1.0000 mg | ORAL_TABLET | Freq: Once | ORAL | Status: AC
  Administered 2018-04-08: 1 mg via ORAL
  Filled 2018-04-08: qty 1

## 2018-04-08 MED ORDER — POTASSIUM CHLORIDE CRYS ER 20 MEQ PO TBCR
40.0000 meq | EXTENDED_RELEASE_TABLET | Freq: Once | ORAL | Status: AC
  Administered 2018-04-08: 40 meq via ORAL
  Filled 2018-04-08: qty 2

## 2018-04-08 NOTE — Progress Notes (Signed)
Progress Note  Patient Name: Laurie Lynch Date of Encounter: 04/08/2018  Primary Cardiologist:   No primary care provider on file.   Subjective   She complains of nausea and feels "bad".    Inpatient Medications    Scheduled Meds: . apixaban  5 mg Oral BID  . busPIRone  30 mg Oral BID  . chlordiazePOXIDE  10 mg Oral TID  . DULoxetine  60 mg Oral Daily  . feeding supplement (ENSURE ENLIVE)  237 mL Oral BID BM  . feeding supplement (PRO-STAT SUGAR FREE 64)  30 mL Oral TID WC  . folic acid  1 mg Oral Daily  . furosemide  40 mg Intravenous Q12H  . gabapentin  600 mg Oral TID  . magnesium oxide  400 mg Oral Daily  . metoprolol succinate  100 mg Oral Daily  . multivitamin with minerals  1 tablet Oral Daily  . pantoprazole  40 mg Oral BID AC  . roflumilast  500 mcg Oral Daily  . sodium chloride flush  3 mL Intravenous Q12H  . vitamin B-1  250 mg Oral Daily  . vitamin B-12  500 mcg Oral Daily   Continuous Infusions:  PRN Meds: acetaminophen **OR** acetaminophen, albuterol, diltiazem, HYDROcodone-acetaminophen, metoprolol tartrate, naphazoline-glycerin, [DISCONTINUED] ondansetron **OR** ondansetron (ZOFRAN) IV, senna-docusate   Vital Signs    Vitals:   04/07/18 1900 04/07/18 2353 04/08/18 0616 04/08/18 0735  BP: 120/72 (!) 158/96 (!) 151/104 (!) 154/89  Pulse: 92 (!) 103 100 94  Resp: 18 17 19    Temp: 98.5 F (36.9 C) 98.6 F (37 C) 98.3 F (36.8 C) 98 F (36.7 C)  TempSrc: Oral Oral Oral Oral  SpO2: 100% 99% 92% 94%  Weight:   86.8 kg   Height:        Intake/Output Summary (Last 24 hours) at 04/08/2018 0855 Last data filed at 04/08/2018 1610 Gross per 24 hour  Intake 415 ml  Output 4150 ml  Net -3735 ml   Filed Weights   04/06/18 0602 04/07/18 0605 04/08/18 0616  Weight: 93.1 kg 89.9 kg 86.8 kg    Telemetry    Atrial fib with RVR - Personally Reviewed  ECG    NA - Personally Reviewed  Physical Exam   GEN: No  acute distress.   Neck: No   JVD Cardiac: Irregular RR, no murmurs, rubs, or gallops.  Respiratory: Clear   to auscultation bilaterally. GI: Soft, nontender, non-distended, normal bowel sounds  MS:  Diffuse  edema; No deformity. Neuro:   Nonfocal  Psych: Oriented and appropriate    Labs    Chemistry Recent Labs  Lab 04/06/18 0258 04/07/18 0712 04/08/18 0706  NA 140 142 142  K 6.0* 3.9 3.4*  CL 103 98 96*  CO2 26 32 33*  GLUCOSE 90 72 86  BUN 8 9 8   CREATININE 0.80 0.77 0.86  CALCIUM 8.4* 8.4* 8.4*  PROT 4.9* 5.6* 5.4*  ALBUMIN 2.4* 2.5* 2.5*  AST 327* 228* 131*  ALT 69* 70* 50*  ALKPHOS 136* 147* 134*  BILITOT 1.4* 1.0 1.1  GFRNONAA >60 >60 >60  GFRAA >60 >60 >60  ANIONGAP 11 12 13      Hematology Recent Labs  Lab 04/05/18 0336 04/06/18 0258 04/08/18 0706  WBC 4.9 5.0 6.5  RBC 3.45* 3.77* 4.09  HGB 10.7* 11.9* 12.9  HCT 33.9* 37.9 39.3  MCV 98.3 100.5* 96.1  MCH 31.0 31.6 31.5  MCHC 31.6 31.4 32.8  RDW 16.8* 16.5* 16.2*  PLT 291  345 255    Cardiac Enzymes Recent Labs  Lab 04/03/18 1704  TROPONINI <0.03    Recent Labs  Lab 04/03/18 1704  TROPIPOC 0.02     BNP Recent Labs  Lab 04/03/18 1705  BNP 466.4*     DDimer No results for input(s): DDIMER in the last 168 hours.   Radiology    No results found.  Cardiac Studies   TTE 10/03/2017 There is normal left ventricular wall thickness. The left ventricular ejection fraction is mildly reduced (45-50%). Septal motion is consistent with conduction abnormality. The aortic valve is trileaflet. Unable to adequately determine diastolic dysfunction. The mitral valve leaflets appear thickened, but open well. There is mild-moderate (1-2+) mitral regurgitation. There is moderate (2+) tricuspid regurgitation. Right ventricular systolic pressure is elevated between 50-46mm Hg, consistent with moderately severe pulmonary hypertension. The left atrium is moderately dilated. The right atrium is mildly dilated.  Patient Profile      57 y.o. female with a PMH ofchronic atrial fibrillation on Eliquis,noncompliance, alcoholism on CIWA protocol, ?HFrEF (09/2017 EF 45-50%), frequent falls, COPD (prescribed home oxygen), depression and anxiety, who is being followed by cardiology for afib RVR.  Assessment & Plan    ATRIAL FIB:    She is on Eliquis although I am not convinced that she is a good long term anticoagulation candidate.    Rate increased to 200 with ambulation.   Increase beta blocker today.    ACUTE ON CHRONIC COMBINED SYSTOLIC AND DIASTOLIC HF:   Down 3.8 liters.  Continue IV diuresis.   HTN:   BP is somewhat labile but better.  I think I will be able to titrate the meds as above.  For questions or updates, please contact [facility] Please consult [email or web address] for contact info under Cardiology/STEMI.   Signed, Rollene Rotunda, MD  04/08/2018, 8:55 AM

## 2018-04-08 NOTE — Progress Notes (Signed)
Laurie Lynch  ZOX:[id number] DOB: 02/07/1961 DOA: 04/03/2018 PCP: Madison Hickman, MD    Brief Narrative:  57 y.o. female with a hx of chronic atrial fibrillation on Eliquis, depression with anxiety, COPD, and alcoholism who presented to the ED w/ periumbilical abdominal pain and melena. Patient was admitted to a [facility] hospital late last month and discharged several days prior to this admit after treatment for acute kidney injury and alcohol-related complications. She was advised to stop Lasix and reduce diltiazem due to low BP's, and to stop Eliquis given recurrent falls and ongoing alcoholism. After returning home she noted general malaise with poor appetite, tenderness in the periumbilical region, and black tarry stools.  She admitted to ongoing daily alcohol use.  She called EMS, was found to be in atrial fibrillation with rapid rate, given a dose of Lopressor and fentanyl, and brought to the ED. In the ED EKG featured atrial fibrillation w/ RVR. CT abd/pelvis was negative for acute findings but notable for severe hepatic steatosis.  CBC was unremarkable.  DRE revealed brown stool that was FOBT positive.    Subjective: Patient states that she feels tired and fatigued.  Complains of abdominal pain.     Assessment & Plan:  Chronic Atrial fibrillation with acute RVR She apparently had been noncompliant with her medications.  CHADS-VASc is 6 and she was on Eliquis at home.  Compliance is unclear.  This has been reinitiated.  Does not appear to be a good long-term candidate for anticoagulation due to compliance issues as well as alcoholism.  Will defer to her primary care provider. TSH is not low.  Patient seen by cardiology.  Rate is better but not adequately controlled.  Dose of beta-blocker increased today by cardiology.  Also previously given digoxin but not thought to be a long-term solution.  Hyperkalemia/hypomagnesemia Replace potassium.  Magnesium level also noted to be low.  This would  also be replaced.    Acute on Chronic right-sided heart failure due to moderate to severe pulmonary hypertension, chronic combined systolic and diastolic heart failure 09/2017 TTE noted EF 45-50% -as mostly upper extremity edema (likely oncotic), likely due to combination of severe pulmonary hypertension along with severe chronic hypoalbuminemia due to alcohol abuse and poor oral intake, placed on protein supplementation, counseled to quit alcohol.  Patient placed on IV Lasix.  She has diuresed well.  Lungs are clear to auscultation.  Continue to monitor closely.  Continue IV Lasix for another day.  Transition to oral tomorrow.  Hypoglycemia No hx of DM - A1c 5.04 October 2017.  This is most likely secondary to alcoholism. Had stable random cortisol, TSH, BHOB, appropriately low C peptide, pending proinsulin/insulin, and sulfonylurea.  CBGs have been stable.  Continue to watch closely.  Encourage oral intake.  Alcoholism with early alcohol withdrawal/abnormal LFTs Patient acknowledged long hx of excessive daily drinking  counseled to quit, refuses any help or referral to AA, was in early DTs.  Symptoms are better.  Now appears to be mainly anxious.  No signs of withdrawal noted at this time.  Abnormal LFTs noted.  Stable.  Moderate PCM with hypoalbuminemia Albumin 2.6 on admission - secondary to alcoholism with liver disease and poor nutrition, placed on pro-stat and ensure.   Occult GI bleeding Brown stool on DRE that is FOBT positive.  Hemoglobin has been stable.  Needs outpatient GI follow-up.  Would be hesitant to continue long-term anticoagulation for this reason as well.  Depression with anxiety Continue Cymbalta, Buspar.  Holding trazodone.  Abdominal pain CT negative for acute findings - appears to be a chronic issue based on review of EMR, exam is benign.  Could be an element of gastritis.  She is on PPI which will be continued.  COPD Well compensated at this time.   Borderline  elevated TSH FT4 is normal - this is sick euthyroid syndrome.  No further work-up.  PCP to repeat TSH in 4 to 6 weeks outpatient.   DVT prophylaxis: SCDs/Eliquis Code Status: FULL CODE Family Communication: Discussed with patient Disposition Plan: Mobilize.  PT and OT evaluation.  Consultants:  Cardiology   Anti-infectives (From admission, onward)   Start     Dose/Rate Route Frequency Ordered Stop   04/04/18 0700  vancomycin (VANCOCIN) 1,250 mg in sodium chloride 0.9 % 250 mL IVPB  Status:  Discontinued     1,250 mg 166.7 mL/hr over 90 Minutes Intravenous Every 12 hours 04/03/18 1753 04/03/18 2226   04/03/18 2300  piperacillin-tazobactam (ZOSYN) IVPB 3.375 g  Status:  Discontinued     3.375 g 12.5 mL/hr over 240 Minutes Intravenous Every 8 hours 04/03/18 1753 04/03/18 2226   04/03/18 1615  piperacillin-tazobactam (ZOSYN) IVPB 3.375 g     3.375 g 100 mL/hr over 30 Minutes Intravenous  Once 04/03/18 1611 04/03/18 1818   04/03/18 1615  vancomycin (VANCOCIN) 2,000 mg in sodium chloride 0.9 % 500 mL IVPB     2,000 mg 250 mL/hr over 120 Minutes Intravenous  Once 04/03/18 1611 04/03/18 2247      Objective:  Blood pressure 109/71, pulse 99, temperature 97.6 F (36.4 C), temperature source Oral, resp. rate 10, height 5\' 9"  (1.753 m), weight 93.1 kg, SpO2 95 %.  Examination:  Awake alert.  In no distress.  Fatigue. Clear to auscultation bilaterally.  No wheezing rales or rhonchi.  Normal effort. S1-S2 is irregularly irregular.  No S3-S4.  Telemetry reviewed.  Heart rate between 95-110. Abdomen remains soft.  Vague tenderness diffusely without any rebound rigidity or guarding.  No masses organomegaly.   Pitting edema noted in all extremities lower and upper.  Slightly better today. No obvious focal neurological deficits.    CBC: Recent Labs  Lab 04/03/18 1704  04/05/18 0336 04/06/18 0258 04/08/18 0706  WBC 5.5   < > 4.9 5.0 6.5  NEUTROABS 3.3  --   --   --   --   HGB 12.3    < > 10.7* 11.9* 12.9  HCT 37.8   < > 33.9* 37.9 39.3  MCV 95.9   < > 98.3 100.5* 96.1  PLT 352   < > 291 345 255   < > = values in this interval not displayed.   Basic Metabolic Panel: Recent Labs  Lab 04/05/18 0336 04/06/18 0258 04/07/18 0712 04/08/18 0706  NA 141 140 142 142  K 4.4 6.0* 3.9 3.4*  CL 104 103 98 96*  CO2 27 26 32 33*  GLUCOSE 116* 90 72 86  BUN 6 8 9 8   CREATININE 0.77 0.80 0.77 0.86  CALCIUM 8.0* 8.4* 8.4* 8.4*  MG 2.0 1.8 1.3* 1.5*  PHOS 4.0  --   --   --    GFR: Estimated Creatinine Clearance: 83.7 mL/min (by C-G formula based on SCr of 0.86 mg/dL).  Liver Function Tests: Recent Labs  Lab 04/05/18 0336 04/06/18 0258 04/07/18 0712 04/08/18 0706  AST 109* 327* 228* 131*  ALT 39 69* 70* 50*  ALKPHOS 112 136* 147* 134*  BILITOT 1.0 1.4*  1.0 1.1  PROT 4.8* 4.9* 5.6* 5.4*  ALBUMIN 2.2* 2.4* 2.5* 2.5*   Recent Labs  Lab 04/03/18 1704  LIPASE 27   Lab Results  Component Value Date   TSH 4.803 (H) 04/03/2018     Cardiac Enzymes: Recent Labs  Lab 04/03/18 1704  TROPONINI <0.03    HbA1C: Hgb A1c MFr Bld  Date/Time Value Ref Range Status  04/03/2018 11:49 PM 5.2 4.8 - 5.6 % Final    Comment:    (NOTE) Pre diabetes:          5.7%-6.4% Diabetes:              >6.4% Glycemic control for   <7.0% adults with diabetes     CBG: Recent Labs  Lab 04/07/18 1643 04/07/18 2000 04/07/18 2353 04/08/18 0732 04/08/18 1133  GLUCAP 86 78 89 83 91    Recent Results (from the past 240 hour(s))  Blood culture (routine x 2)     Status: None   Collection Time: 04/03/18  4:12 PM  Result Value Ref Range Status   Specimen Description BLOOD RIGHT ARM  Final   Special Requests   Final    BOTTLES DRAWN AEROBIC AND ANAEROBIC Blood Culture adequate volume   Culture   Final    NO GROWTH 5 DAYS Performed at [facility], [address], [place], [place] [address]    Report Status 04/08/2018 FINAL  Final  MRSA PCR Screening     Status: None     Collection Time: 04/03/18 11:59 PM  Result Value Ref Range Status   MRSA by PCR NEGATIVE NEGATIVE Final    Comment:        The GeneXpert MRSA Assay (FDA approved for NASAL specimens only), is one component of a comprehensive MRSA colonization surveillance program. It is not intended to diagnose MRSA infection nor to guide or monitor treatment for MRSA infections. Performed at [facility], [address], [place], [place] [address]   C difficile quick scan w PCR reflex     Status: None   Collection Time: 04/05/18 11:23 PM  Result Value Ref Range Status   C Diff antigen NEGATIVE NEGATIVE Final   C Diff toxin NEGATIVE NEGATIVE Final   C Diff interpretation No C. difficile detected.  Final    Comment: Performed at [facility], [address] [place], [place] [address]     Scheduled Meds: . apixaban  5 mg Oral BID  . busPIRone  30 mg Oral BID  . chlordiazePOXIDE  10 mg Oral TID  . DULoxetine  60 mg Oral Daily  . feeding supplement (ENSURE ENLIVE)  237 mL Oral BID BM  . feeding supplement (PRO-STAT SUGAR FREE 64)  30 mL Oral TID WC  . folic acid  1 mg Oral Daily  . furosemide  40 mg Intravenous Q12H  . gabapentin  600 mg Oral TID  . magnesium oxide  400 mg Oral Daily  . metoprolol succinate  150 mg Oral Daily  . multivitamin with minerals  1 tablet Oral Daily  . pantoprazole  40 mg Oral BID AC  . roflumilast  500 mcg Oral Daily  . sodium chloride flush  3 mL Intravenous Q12H  . vitamin B-1  250 mg Oral Daily  . vitamin B-12  500 mcg Oral Daily   Continuous Infusions:    LOS: 4 days   Signature  Osvaldo Shipper M.D on 04/08/2018 at 12:27 PM  To page go  to [email or web address] - password [facility]

## 2018-04-09 LAB — COMPREHENSIVE METABOLIC PANEL
ALT: 38 U/L (ref 0–44)
AST: 86 U/L — AB (ref 15–41)
Albumin: 2.4 g/dL — ABNORMAL LOW (ref 3.5–5.0)
Alkaline Phosphatase: 130 U/L — ABNORMAL HIGH (ref 38–126)
Anion gap: 12 (ref 5–15)
BILIRUBIN TOTAL: 0.8 mg/dL (ref 0.3–1.2)
BUN: 7 mg/dL (ref 6–20)
CHLORIDE: 97 mmol/L — AB (ref 98–111)
CO2: 32 mmol/L (ref 22–32)
Calcium: 8.2 mg/dL — ABNORMAL LOW (ref 8.9–10.3)
Creatinine, Ser: 0.74 mg/dL (ref 0.44–1.00)
GFR calc Af Amer: >60 mL/min (ref >60)
GFR calc non Af Amer: >60 mL/min (ref >60)
GLUCOSE: 83 mg/dL (ref 70–99)
POTASSIUM: 3.5 mmol/L (ref 3.5–5.1)
Sodium: 141 mmol/L (ref 135–145)
TOTAL PROTEIN: 5.4 g/dL — AB (ref 6.5–8.1)

## 2018-04-09 LAB — GLUCOSE, CAPILLARY
GLUCOSE-CAPILLARY: 141 mg/dL — AB (ref 70–99)
GLUCOSE-CAPILLARY: 75 mg/dL (ref 70–99)
Glucose-Capillary: 55 mg/dL — ABNORMAL LOW (ref 70–99)
Glucose-Capillary: 86 mg/dL (ref 70–99)

## 2018-04-09 LAB — MAGNESIUM: Magnesium: 1.6 mg/dL — ABNORMAL LOW (ref 1.7–2.4)

## 2018-04-09 LAB — PROINSULIN/INSULIN RATIO
Insulin: 1.3 u[IU]/mL
Proinsulin/Insulin Ratio: 45 %
Proinsulin: 3.9 pmol/L

## 2018-04-09 MED ORDER — CHLORDIAZEPOXIDE HCL 5 MG PO CAPS
10.0000 mg | ORAL_CAPSULE | Freq: Two times a day (BID) | ORAL | Status: DC
  Administered 2018-04-10 – 2018-04-15 (×11): 10 mg via ORAL
  Filled 2018-04-09 (×11): qty 2

## 2018-04-09 MED ORDER — POTASSIUM CHLORIDE CRYS ER 20 MEQ PO TBCR
40.0000 meq | EXTENDED_RELEASE_TABLET | Freq: Once | ORAL | Status: AC
  Administered 2018-04-09: 40 meq via ORAL
  Filled 2018-04-09: qty 2

## 2018-04-09 MED ORDER — MAGNESIUM OXIDE 400 (241.3 MG) MG PO TABS
400.0000 mg | ORAL_TABLET | Freq: Two times a day (BID) | ORAL | Status: DC
Start: 2018-04-09 — End: 2018-04-12
  Administered 2018-04-09 – 2018-04-12 (×6): 400 mg via ORAL
  Filled 2018-04-09 (×6): qty 1

## 2018-04-09 MED ORDER — MAGNESIUM SULFATE 2 GM/50ML IV SOLN
2.0000 g | Freq: Once | INTRAVENOUS | Status: AC
  Administered 2018-04-09: 2 g via INTRAVENOUS
  Filled 2018-04-09: qty 50

## 2018-04-09 MED ORDER — LORAZEPAM 0.5 MG PO TABS
0.5000 mg | ORAL_TABLET | Freq: Two times a day (BID) | ORAL | Status: DC | PRN
  Administered 2018-04-09 – 2018-04-13 (×6): 0.5 mg via ORAL
  Filled 2018-04-09 (×6): qty 1

## 2018-04-09 MED ORDER — FUROSEMIDE 40 MG PO TABS
40.0000 mg | ORAL_TABLET | Freq: Every day | ORAL | Status: DC
  Administered 2018-04-10 – 2018-04-15 (×6): 40 mg via ORAL
  Filled 2018-04-09 (×6): qty 1

## 2018-04-09 MED ORDER — DILTIAZEM HCL 60 MG PO TABS
30.0000 mg | ORAL_TABLET | Freq: Three times a day (TID) | ORAL | Status: DC
  Administered 2018-04-09 – 2018-04-14 (×16): 30 mg via ORAL
  Filled 2018-04-09 (×16): qty 1

## 2018-04-09 NOTE — Progress Notes (Signed)
Laurie Lynch  ZOX:[id number] DOB: [birth year] DOA: 04/03/2018 PCP: Madison Hickman, MD    Brief Narrative:  57 y.o. female with a hx of chronic atrial fibrillation on Eliquis, depression with anxiety, COPD, and alcoholism who presented to the ED w/ periumbilical abdominal pain and melena. Patient was admitted to a [facility] hospital late last month and discharged several days prior to this admit after treatment for acute kidney injury and alcohol-related complications. She was advised to stop Lasix and reduce diltiazem due to low BP's, and to stop Eliquis given recurrent falls and ongoing alcoholism. After returning home she noted general malaise with poor appetite, tenderness in the periumbilical region, and black tarry stools.  She admitted to ongoing daily alcohol use.  She called EMS, was found to be in atrial fibrillation with rapid rate, given a dose of Lopressor and fentanyl, and brought to the ED. In the ED EKG featured atrial fibrillation w/ RVR. CT abd/pelvis was negative for acute findings but notable for severe hepatic steatosis.  CBC was unremarkable.  DRE revealed brown stool that was FOBT positive.    Subjective: Patient complains of headache behind her eyes.  She usually uses corrective lenses which she does not have here in the hospital.  Denies chest pain or shortness of breath.   Assessment & Plan:  Chronic Atrial fibrillation with acute RVR She apparently had been noncompliant with her medications.  CHADS-VASc is 6 and she was on Eliquis at home.  Compliance is unclear.  This has been reinitiated.  Does not appear to be a good long-term candidate for anticoagulation due to compliance issues as well as alcoholism.  Will defer to her primary care provider. TSH is not low.  Cardiology is following.  Heart rate is better controlled.  Her dose of beta-blocker was increased yesterday.  Cardiology is adding Cardizem this morning.  She was also given digoxin during this hospitalization but  this is not thought to be a good long-term solution for her.   Hyperkalemia/hypomagnesemia Centimeters improved but still.  She will be given additional dose today.  Magnesium also has improved but remains low.  This will also be repleted.    Acute on Chronic right-sided heart failure due to moderate to severe pulmonary hypertension, chronic combined systolic and diastolic heart failure 09/2017 TTE noted EF 45-50% -as mostly upper extremity edema (likely oncotic), likely due to combination of severe pulmonary hypertension along with severe chronic hypoalbuminemia due to alcohol abuse and poor oral intake, placed on protein supplementation, counseled to quit alcohol.  Patient placed on IV Lasix.  Patient has diuresed well.  She is now on room air.  Change to oral furosemide.    Hypoglycemia No hx of DM - A1c 5.04 October 2017.  This is most likely secondary to alcoholism. Had stable random cortisol, TSH, BHOB, appropriately low C peptide, pending proinsulin/insulin, and sulfonylurea.  CBGs have been stable.  Encourage oral intake.  Alcoholism with early alcohol withdrawal/abnormal LFTs Patient acknowledged long hx of excessive daily drinking  counseled to quit, refuses any help or referral to AA, was in early DTs.  Symptoms are better.  Now appears to be mainly anxious.  No signs of withdrawal noted at this time.  Did have mildly abnormal LFTs which are stable now.    Moderate PCM with hypoalbuminemia Albumin 2.6 on admission - secondary to alcoholism with liver disease and poor nutrition, placed on pro-stat and ensure.   Occult GI bleeding Brown stool on DRE that is FOBT positive.  Hemoglobin has been stable.  Needs outpatient GI follow-up.  Would be hesitant to continue long-term anticoagulation for this reason as well.  Depression with anxiety Continue Cymbalta, Buspar.  Holding trazodone.  Abdominal pain CT negative for acute findings - appears to be a chronic issue based on review of EMR,  exam is benign.  Could be an element of gastritis.  She is on PPI which will be continued.  COPD Well compensated at this time.   Borderline elevated TSH FT4 is normal - this is sick euthyroid syndrome.  No further work-up.  PCP to repeat TSH in 4 to 6 weeks outpatient.   DVT prophylaxis: SCDs/Eliquis Code Status: FULL CODE Family Communication: Discussed with patient Disposition Plan: Seen by physical therapy who felt like patient may need skilled nursing facility for rehab but they also feel that she may improve while she is in the hospital.    Consultants:  Cardiology   Anti-infectives (From admission, onward)   Start     Dose/Rate Route Frequency Ordered Stop   04/04/18 0700  vancomycin (VANCOCIN) 1,250 mg in sodium chloride 0.9 % 250 mL IVPB  Status:  Discontinued     1,250 mg 166.7 mL/hr over 90 Minutes Intravenous Every 12 hours 04/03/18 1753 04/03/18 2226   04/03/18 2300  piperacillin-tazobactam (ZOSYN) IVPB 3.375 g  Status:  Discontinued     3.375 g 12.5 mL/hr over 240 Minutes Intravenous Every 8 hours 04/03/18 1753 04/03/18 2226   04/03/18 1615  piperacillin-tazobactam (ZOSYN) IVPB 3.375 g     3.375 g 100 mL/hr over 30 Minutes Intravenous  Once 04/03/18 1611 04/03/18 1818   04/03/18 1615  vancomycin (VANCOCIN) 2,000 mg in sodium chloride 0.9 % 500 mL IVPB     2,000 mg 250 mL/hr over 120 Minutes Intravenous  Once 04/03/18 1611 04/03/18 2247      Objective:  Blood pressure 109/71, pulse 99, temperature 97.6 F (36.4 C), temperature source Oral, resp. rate 10, height 5\' 9"  (1.753 m), weight 93.1 kg, SpO2 95 %.  Examination:  Awake alert.  In no distress Lungs clear to auscultation bilaterally.  No wheezing rales or rhonchi.  Normal effort. S1-S2 is irregularly irregular.  No S3-S4.  Telemetry reviewed.  Heart rate between 90-105 Abdomen remains soft.  Weak tenderness diffusely without any rebound rigidity or guarding.    Improving edema in the extremities.     Alert and oriented x3.  Cranial nerves II to XII intact.  No obvious focal neurological deficits.    CBC: Recent Labs  Lab 04/03/18 1704  04/05/18 0336 04/06/18 0258 04/08/18 0706  WBC 5.5   < > 4.9 5.0 6.5  NEUTROABS 3.3  --   --   --   --   HGB 12.3   < > 10.7* 11.9* 12.9  HCT 37.8   < > 33.9* 37.9 39.3  MCV 95.9   < > 98.3 100.5* 96.1  PLT 352   < > 291 345 255   < > = values in this interval not displayed.   Basic Metabolic Panel: Recent Labs  Lab 04/05/18 0336  04/07/18 0712 04/08/18 0706 04/09/18 0507  NA 141   < > 142 142 141  K 4.4   < > 3.9 3.4* 3.5  CL 104   < > 98 96* 97*  CO2 27   < > 32 33* 32  GLUCOSE 116*   < > 72 86 83  BUN 6   < > 9 8 7   CREATININE  0.77   < > 0.77 0.86 0.74  CALCIUM 8.0*   < > 8.4* 8.4* 8.2*  MG 2.0   < > 1.3* 1.5* 1.6*  PHOS 4.0  --   --   --   --    < > = values in this interval not displayed.   GFR: Estimated Creatinine Clearance: 90 mL/min (by C-G formula based on SCr of 0.74 mg/dL).  Liver Function Tests: Recent Labs  Lab 04/06/18 0258 04/07/18 0712 04/08/18 0706 04/09/18 0507  AST 327* 228* 131* 86*  ALT 69* 70* 50* 38  ALKPHOS 136* 147* 134* 130*  BILITOT 1.4* 1.0 1.1 0.8  PROT 4.9* 5.6* 5.4* 5.4*  ALBUMIN 2.4* 2.5* 2.5* 2.4*   Recent Labs  Lab 04/03/18 1704  LIPASE 27   Lab Results  Component Value Date   TSH 4.803 (H) 04/03/2018     Cardiac Enzymes: Recent Labs  Lab 04/03/18 1704  TROPONINI <0.03    HbA1C: Hgb A1c MFr Bld  Date/Time Value Ref Range Status  04/03/2018 11:49 PM 5.2 4.8 - 5.6 % Final    Comment:    (NOTE) Pre diabetes:          5.7%-6.4% Diabetes:              >6.4% Glycemic control for   <7.0% adults with diabetes     CBG: Recent Labs  Lab 04/07/18 2000 04/07/18 2353 04/08/18 0732 04/08/18 1133 04/08/18 1628  GLUCAP 78 89 83 91 92    Recent Results (from the past 240 hour(s))  Blood culture (routine x 2)     Status: None   Collection Time: 04/03/18  4:12 PM   Result Value Ref Range Status   Specimen Description BLOOD RIGHT ARM  Final   Special Requests   Final    BOTTLES DRAWN AEROBIC AND ANAEROBIC Blood Culture adequate volume   Culture   Final    NO GROWTH 5 DAYS Performed at [facility], [address] [place], [place] [address]    Report Status 04/08/2018 FINAL  Final  MRSA PCR Screening     Status: None   Collection Time: 04/03/18 11:59 PM  Result Value Ref Range Status   MRSA by PCR NEGATIVE NEGATIVE Final    Comment:        The GeneXpert MRSA Assay (FDA approved for NASAL specimens only), is one component of a comprehensive MRSA colonization surveillance program. It is not intended to diagnose MRSA infection nor to guide or monitor treatment for MRSA infections. Performed at [facility], [address] [place], [place] [address]   C difficile quick scan w PCR reflex     Status: None   Collection Time: 04/05/18 11:23 PM  Result Value Ref Range Status   C Diff antigen NEGATIVE NEGATIVE Final   C Diff toxin NEGATIVE NEGATIVE Final   C Diff interpretation No C. difficile detected.  Final    Comment: Performed at [facility], [address], [place], [place] [address]     Scheduled Meds: . apixaban  5 mg Oral BID  . busPIRone  30 mg Oral BID  . chlordiazePOXIDE  10 mg Oral TID  . DULoxetine  60 mg Oral Daily  . feeding supplement (ENSURE ENLIVE)  237 mL Oral BID BM  . feeding supplement (PRO-STAT SUGAR FREE 64)  30 mL Oral TID WC  . folic acid  1 mg Oral Daily  . furosemide  40 mg Intravenous  Q12H  . gabapentin  600 mg Oral TID  . magnesium oxide  400 mg Oral Daily  . metoprolol succinate  150 mg Oral Daily  . multivitamin with minerals  1 tablet Oral Daily  . pantoprazole  40 mg Oral BID AC  . potassium chloride  40 mEq Oral Once  . roflumilast  500 mcg Oral Daily  . sodium chloride flush  3 mL Intravenous Q12H  . vitamin B-1  250 mg Oral Daily  . vitamin B-12  500 mcg Oral Daily    Continuous Infusions: . magnesium sulfate 1 - 4 g bolus IVPB       LOS: 5 days   Signature  Osvaldo Shipper M.D on 04/09/2018 at 9:42 AM  To page go to [email or web address] - password [facility]

## 2018-04-09 NOTE — Progress Notes (Signed)
Progress Note  Patient Name: Laurie Lynch Date of Encounter: 04/09/2018  Primary Cardiologist:   No primary care provider on file.   Subjective   Nauseated and sore all over.  No acute chest pain or SOB.   Inpatient Medications    Scheduled Meds: . apixaban  5 mg Oral BID  . busPIRone  30 mg Oral BID  . chlordiazePOXIDE  10 mg Oral TID  . DULoxetine  60 mg Oral Daily  . feeding supplement (ENSURE ENLIVE)  237 mL Oral BID BM  . feeding supplement (PRO-STAT SUGAR FREE 64)  30 mL Oral TID WC  . folic acid  1 mg Oral Daily  . furosemide  40 mg Intravenous Q12H  . gabapentin  600 mg Oral TID  . magnesium oxide  400 mg Oral Daily  . metoprolol succinate  150 mg Oral Daily  . multivitamin with minerals  1 tablet Oral Daily  . pantoprazole  40 mg Oral BID AC  . roflumilast  500 mcg Oral Daily  . sodium chloride flush  3 mL Intravenous Q12H  . vitamin B-1  250 mg Oral Daily  . vitamin B-12  500 mcg Oral Daily   Continuous Infusions: . magnesium sulfate 1 - 4 g bolus IVPB Stopped (04/09/18 1010)   PRN Meds: acetaminophen **OR** acetaminophen, albuterol, diltiazem, HYDROcodone-acetaminophen, metoprolol tartrate, naphazoline-glycerin, [DISCONTINUED] ondansetron **OR** ondansetron (ZOFRAN) IV, senna-docusate   Vital Signs    Vitals:   04/09/18 0609 04/09/18 0630 04/09/18 0631 04/09/18 1021  BP: (!) 154/125 (!) 161/114 (!) 161/114 (!) 140/93  Pulse:    93  Resp: 16 15 16 15   Temp:   98 F (36.7 C) 97.6 F (36.4 C)  TempSrc:   Axillary Oral  SpO2:  97% 97% 100%  Weight:      Height:        Intake/Output Summary (Last 24 hours) at 04/09/2018 1035 Last data filed at 04/09/2018 1027 Gross per 24 hour  Intake 2.7 ml  Output 4500 ml  Net -4497.3 ml   Filed Weights   04/07/18 0605 04/08/18 0616 04/09/18 0500  Weight: 89.9 kg 86.8 kg 86.8 kg    Telemetry    Atrial fib with rapid rate - Personally Reviewed  ECG    NA - Personally Reviewed  Physical Exam   GEN: No   acute distress.   Neck: No  JVD Cardiac: Irregular RR, no murmurs, rubs, or gallops.  Respiratory: Clear   to auscultation bilaterally. GI: Soft, nontender, non-distended, normal bowel sounds  MS:  Decreasing diffuse limb edema; No deformity. Neuro:   Nonfocal  Psych: Oriented and appropriate    Labs    Chemistry Recent Labs  Lab 04/07/18 0712 04/08/18 0706 04/09/18 0507  NA 142 142 141  K 3.9 3.4* 3.5  CL 98 96* 97*  CO2 32 33* 32  GLUCOSE 72 86 83  BUN 9 8 7   CREATININE 0.77 0.86 0.74  CALCIUM 8.4* 8.4* 8.2*  PROT 5.6* 5.4* 5.4*  ALBUMIN 2.5* 2.5* 2.4*  AST 228* 131* 86*  ALT 70* 50* 38  ALKPHOS 147* 134* 130*  BILITOT 1.0 1.1 0.8  GFRNONAA >60 >60 >60  GFRAA >60 >60 >60  ANIONGAP 12 13 12      Hematology Recent Labs  Lab 04/05/18 0336 04/06/18 0258 04/08/18 0706  WBC 4.9 5.0 6.5  RBC 3.45* 3.77* 4.09  HGB 10.7* 11.9* 12.9  HCT 33.9* 37.9 39.3  MCV 98.3 100.5* 96.1  MCH 31.0 31.6 31.5  MCHC 31.6 31.4 32.8  RDW 16.8* 16.5* 16.2*  PLT 291 345 255    Cardiac Enzymes Recent Labs  Lab 04/03/18 1704  TROPONINI <0.03    Recent Labs  Lab 04/03/18 1704  TROPIPOC 0.02     BNP Recent Labs  Lab 04/03/18 1705  BNP 466.4*     DDimer No results for input(s): DDIMER in the last 168 hours.   Radiology    No results found.  Cardiac Studies   TTE 10/03/2017 There is normal left ventricular wall thickness. The left ventricular ejection fraction is mildly reduced (45-50%). Septal motion is consistent with conduction abnormality. The aortic valve is trileaflet. Unable to adequately determine diastolic dysfunction. The mitral valve leaflets appear thickened, but open well. There is mild-moderate (1-2+) mitral regurgitation. There is moderate (2+) tricuspid regurgitation. Right ventricular systolic pressure is elevated between 50-86mm Hg, consistent with moderately severe pulmonary hypertension. The left atrium is moderately dilated. The right  atrium is mildly dilated.  Patient Profile     57 y.o. female with a PMH ofchronic atrial fibrillation on Eliquis,noncompliance, alcoholism on CIWA protocol, ?HFrEF (09/2017 EF 45-50%), frequent falls, COPD (prescribed home oxygen), depression and anxiety, who is being followed by cardiology for afib RVR.  Assessment & Plan    ATRIAL FIB:    She is on Eliquis although I am not convinced that she is a good long term anticoagulation candidate.     Increased beta blocker yesterday.  I am going to add PO Cardizem today.  Likely would go home on Cardizem CD and beta blocker.    ACUTE ON CHRONIC COMBINED SYSTOLIC AND DIASTOLIC HF:   Down 2.9 liters yesterday and 8.3 since admission.  On PO diuretics.   HTN:   BP is somewhat labile but better. Add meds as above.   For questions or updates, please contact [facility] Please consult [email or web address] for contact info under Cardiology/STEMI.   Signed, Rollene Rotunda, MD  04/09/2018, 10:35 AM

## 2018-04-10 DIAGNOSIS — R296 Repeated falls: Secondary | ICD-10-CM

## 2018-04-10 DIAGNOSIS — F1020 Alcohol dependence, uncomplicated: Secondary | ICD-10-CM

## 2018-04-10 LAB — GLUCOSE, CAPILLARY
GLUCOSE-CAPILLARY: 100 mg/dL — AB (ref 70–99)
Glucose-Capillary: 106 mg/dL — ABNORMAL HIGH (ref 70–99)
Glucose-Capillary: 82 mg/dL (ref 70–99)
Glucose-Capillary: 87 mg/dL (ref 70–99)
Glucose-Capillary: 95 mg/dL (ref 70–99)
Glucose-Capillary: 97 mg/dL (ref 70–99)

## 2018-04-10 LAB — BASIC METABOLIC PANEL
Anion gap: 12 (ref 5–15)
BUN: 8 mg/dL (ref 6–20)
CO2: 31 mmol/L (ref 22–32)
Calcium: 8.8 mg/dL — ABNORMAL LOW (ref 8.9–10.3)
Chloride: 97 mmol/L — ABNORMAL LOW (ref 98–111)
Creatinine, Ser: 0.77 mg/dL (ref 0.44–1.00)
GFR calc Af Amer: >60 mL/min (ref >60)
GFR calc non Af Amer: >60 mL/min (ref >60)
Glucose, Bld: 90 mg/dL (ref 70–99)
Potassium: 3.6 mmol/L (ref 3.5–5.1)
Sodium: 140 mmol/L (ref 135–145)

## 2018-04-10 LAB — MAGNESIUM: MAGNESIUM: 1.8 mg/dL (ref 1.7–2.4)

## 2018-04-10 MED ORDER — POTASSIUM CHLORIDE CRYS ER 20 MEQ PO TBCR
40.0000 meq | EXTENDED_RELEASE_TABLET | Freq: Once | ORAL | Status: AC
  Administered 2018-04-10: 40 meq via ORAL
  Filled 2018-04-10: qty 2

## 2018-04-10 MED ORDER — LORAZEPAM 0.5 MG PO TABS
0.5000 mg | ORAL_TABLET | Freq: Once | ORAL | Status: DC
  Filled 2018-04-10: qty 1

## 2018-04-10 MED ORDER — METOPROLOL SUCCINATE ER 100 MG PO TB24
100.0000 mg | ORAL_TABLET | Freq: Two times a day (BID) | ORAL | Status: DC
  Administered 2018-04-11 – 2018-04-15 (×9): 100 mg via ORAL
  Filled 2018-04-10 (×9): qty 1

## 2018-04-10 NOTE — Care Management Note (Signed)
Case Management Note  Patient Details  Name: Laurie Lynch MRN: [id number] Date of Birth: July 08, 1961  Subjective/Objective: Pt presented for Abdominal pain-melena and found to be in Atrial Fib. Question compliance with medications at home. Per patient she has a caregiver that assists her in the home. Patient is currently active with [facility] RN Services. Patient will need a HH RN order and F2F once she is stable to transition home.                    Action/Plan: Dan with [facility] is aware that patient is hospitalized. No further needs from CM at this time.   Expected Discharge Date:                  Expected Discharge Plan:  Home w Home Health Services  In-House Referral:  NA  Discharge planning Services  CM Consult  Post Acute Care Choice:  Home Health, Resumption of Svcs/PTA Provider Choice offered to:  Patient  DME Arranged:  N/A DME Agency:  NA  HH Arranged:  RN, Disease Management HH Agency:  [facility]  Status of Service:  Completed, signed off  If discussed at Long Length of Stay Meetings, dates discussed:    Additional Comments:  Gala Lewandowsky, RN 04/10/2018, 3:36 PM

## 2018-04-10 NOTE — Progress Notes (Signed)
Progress Note  Patient Name: Laurie Lynch Date of Encounter: 04/10/2018  Primary Cardiologist: New (Dr. Antoine Poche)   Subjective   Asymptomatic at rest but pt notes some exertional dyspnea with ambulation. No CP.   Inpatient Medications    Scheduled Meds: . apixaban  5 mg Oral BID  . busPIRone  30 mg Oral BID  . chlordiazePOXIDE  10 mg Oral BID  . diltiazem  30 mg Oral Q8H  . DULoxetine  60 mg Oral Daily  . feeding supplement (ENSURE ENLIVE)  237 mL Oral BID BM  . feeding supplement (PRO-STAT SUGAR FREE 64)  30 mL Oral TID WC  . folic acid  1 mg Oral Daily  . furosemide  40 mg Oral Daily  . gabapentin  600 mg Oral TID  . magnesium oxide  400 mg Oral BID  . metoprolol succinate  150 mg Oral Daily  . multivitamin with minerals  1 tablet Oral Daily  . pantoprazole  40 mg Oral BID AC  . roflumilast  500 mcg Oral Daily  . sodium chloride flush  3 mL Intravenous Q12H  . vitamin B-1  250 mg Oral Daily  . vitamin B-12  500 mcg Oral Daily   Continuous Infusions:  PRN Meds: acetaminophen **OR** acetaminophen, albuterol, diltiazem, HYDROcodone-acetaminophen, LORazepam, metoprolol tartrate, naphazoline-glycerin, [DISCONTINUED] ondansetron **OR** ondansetron (ZOFRAN) IV, senna-docusate   Vital Signs    Vitals:   04/09/18 2354 04/10/18 0300 04/10/18 0645 04/10/18 0733  BP: 117/79 124/80 (!) 137/109 124/64  Pulse: (!) 106 (!) 102  84  Resp: 18 16  18   Temp: 98.2 F (36.8 C) 98 F (36.7 C)  (!) 97.5 F (36.4 C)  TempSrc: Oral Oral  Oral  SpO2: 99% 99%    Weight:  82.1 kg    Height:       No intake or output data in the 24 hours ending 04/10/18 1110 Filed Weights   04/08/18 0616 04/09/18 0500 04/10/18 0300  Weight: 86.8 kg 86.8 kg 82.1 kg    Telemetry    Atrial fibrillation, 80s-90s - Personally Reviewed  ECG    04/03/18, afib 97 bpm - Personally Reviewed  Physical Exam   GEN: WF, looks older than age, in no acute distress.   Neck: No JVD Cardiac: irregularly  irregular, regular rate no  no murmurs, rubs, or gallops.  Respiratory: decreased BS at the bases bilaterally. GI: Soft, nontender, non-distended  MS: No edema; No deformity. Neuro:  Nonfocal  Psych: Normal affect   Labs    Chemistry Recent Labs  Lab 04/07/18 0712 04/08/18 0706 04/09/18 0507 04/10/18 0545  NA 142 142 141 140  K 3.9 3.4* 3.5 3.6  CL 98 96* 97* 97*  CO2 32 33* 32 31  GLUCOSE 72 86 83 90  BUN 9 8 7 8   CREATININE 0.77 0.86 0.74 0.77  CALCIUM 8.4* 8.4* 8.2* 8.8*  PROT 5.6* 5.4* 5.4*  --   ALBUMIN 2.5* 2.5* 2.4*  --   AST 228* 131* 86*  --   ALT 70* 50* 38  --   ALKPHOS 147* 134* 130*  --   BILITOT 1.0 1.1 0.8  --   GFRNONAA >60 >60 >60 >60  GFRAA >60 >60 >60 >60  ANIONGAP 12 13 12 12      Hematology Recent Labs  Lab 04/05/18 0336 04/06/18 0258 04/08/18 0706  WBC 4.9 5.0 6.5  RBC 3.45* 3.77* 4.09  HGB 10.7* 11.9* 12.9  HCT 33.9* 37.9 39.3  MCV 98.3 100.5* 96.1  MCH 31.0 31.6 31.5  MCHC 31.6 31.4 32.8  RDW 16.8* 16.5* 16.2*  PLT 291 345 255    Cardiac Enzymes Recent Labs  Lab 04/03/18 1704  TROPONINI <0.03    Recent Labs  Lab 04/03/18 1704  TROPIPOC 0.02     BNP Recent Labs  Lab 04/03/18 1705  BNP 466.4*     DDimer No results for input(s): DDIMER in the last 168 hours.   Radiology    No results found.  Cardiac Studies   2D Echo 10/03/2017 There is normal left ventricular wall thickness. The left ventricular ejection fraction is mildly reduced (45-50%). Septal motion is consistent with conduction abnormality. The aortic valve is trileaflet. Unable to adequately determine diastolic dysfunction. The mitral valve leaflets appear thickened, but open well. There is mild-moderate (1-2+) mitral regurgitation. There is moderate (2+) tricuspid regurgitation. Right ventricular systolic pressure is elevated between 50-64mm Hg, consistent with moderately severe pulmonary hypertension. The left atrium is moderately dilated. The right  atrium is mildly dilated.  Patient Profile     57 y.o. female with a PMH ofchronic atrial fibrillation on Eliquis,noncompliance, alcoholism on CIWA protocol, ?HFrEF (09/2017 EF 45-50%), frequent falls, COPD (prescribed home oxygen), depression and anxiety, who is being followed by cardiology for afib RVR.  Assessment & Plan    1. Atrial Fibrillation: rate is controlled on BB and CCB. Short acting Cardizem added yesterday for additional rate control, 30 mg q8H. Current v- rates in the 80s-90s resting. BP stable on Cardizem. Transition to long acting Cardizem today at 120 mg/daily. Continue metoprolol 150 mg daily. Pt is currently on Eliquis for a/c, however given her ETOH history, she may not be a good candidate for long term anticoagulation, given bleed and fall risk. Will defer to MD.   2. Acute on Chronic Combined Systolic and Diastolic HF: Echo 10/03/17 showed mildly reduced LVEF at 45-50%. Good diuresis thus far. Net I/Os negative 8.3L total. No edema on exam. Continue PO lasix, 40 mg daily.   3. HTN: controlled this morning at 124/64.   4. ETOH Abuse: management per IM. On CIWA protocol.   For questions or updates, please contact [facility] Please consult [email or web address] for contact info under        Signed, Robbie Lis, PA-C  04/10/2018, 11:10 AM

## 2018-04-10 NOTE — Progress Notes (Signed)
Physical Therapy Treatment Patient Details Name: Laurie Lynch MRN: [id number] DOB: Jun 18, 1961 Today's Date: 04/10/2018    History of Present Illness Pt presented with abdominal pain and melena. Pt found to have A-fib with RVR and acute on chronic rt side heart failure. Pt with early signs of ETOH withdrawal and placed on CIWA protocol.  Pt with recent admit to [facility] with AKI and alcohol related problems. PMH - ETOH abuse, depression, copd    PT Comments    Pt with much steadier gait today. Pt would only amb around bed to bsc and back. Pt refused to amb any further. Pt modified independent with gait. HR 90 at rest and 125 with amb to bsc. Do not feel pt will need PT at DC now that alcohol withdrawal is better. Currently pt is self limiting on amount of mobility.    Follow Up Recommendations  No PT follow up     Equipment Recommendations  None recommended by PT    Recommendations for Other Services       Precautions / Restrictions Precautions Precautions: Fall;Other (comment) Precaution Comments: tachycardia- watch HR Restrictions Weight Bearing Restrictions: No    Mobility  Bed Mobility Overal bed mobility: Independent Bed Mobility: Sit to Supine;Supine to Sit     Supine to sit: Independent Sit to supine: Independent      Transfers Overall transfer level: Modified independent Equipment used: None Transfers: Sit to/from Stand Sit to Stand: Modified independent (Device/Increase time)            Ambulation/Gait Ambulation/Gait assistance: Modified independent (Device/Increase time) Gait Distance (Feet): 12 Feet(x 2) Assistive device: None Gait Pattern/deviations: Step-through pattern   Gait velocity interpretation: >2.62 ft/sec, indicative of community ambulatory General Gait Details: Steady gait from bed to bsc walking around the end of the bed. Pt adamantly refusing to amb further.   Stairs             Wheelchair Mobility    Modified Rankin  (Stroke Patients Only)       Balance Overall balance assessment: Independent                                          Cognition Arousal/Alertness: Awake/alert Behavior During Therapy: Flat affect Overall Cognitive Status: No family/caregiver present to determine baseline cognitive functioning Area of Impairment: Safety/judgement                         Safety/Judgement: Decreased awareness of safety            Exercises      General Comments        Pertinent Vitals/Pain      Home Living                      Prior Function            PT Goals (current goals can now be found in the care plan section) Progress towards PT goals: Progressing toward goals    Frequency    Min 3X/week      PT Plan Discharge plan needs to be updated    Co-evaluation              AM-PAC PT "6 Clicks" Daily Activity  Outcome Measure  Difficulty turning over in bed (including adjusting bedclothes, sheets and blankets)?: None Difficulty moving from lying on  back to sitting on the side of the bed? : None Difficulty sitting down on and standing up from a chair with arms (e.g., wheelchair, bedside commode, etc,.)?: None Help needed moving to and from a bed to chair (including a wheelchair)?: None Help needed walking in hospital room?: None Help needed climbing 3-5 steps with a railing? : A Little 6 Click Score: 23    End of Session   Activity Tolerance: Other (comment)(Pt self limiting) Patient left: in bed;with call bell/phone within reach Nurse Communication: Mobility status PT Visit Diagnosis: Other abnormalities of gait and mobility (R26.89);Muscle weakness (generalized) (M62.81)     Time: 1610-9604 PT Time Calculation (min) (ACUTE ONLY): 8 min  Charges:  $Gait Training: 8-22 mins                     [facility] PT Acute Rehabilitation Services Pager [phone] Office [phone]    Angelina Ok [facility] 04/10/2018, 10:29  AM

## 2018-04-10 NOTE — Clinical Social Work Note (Signed)
Clinical Social Work Assessment  Patient Details  Name: Laurie Lynch MRN: [id number] Date of Birth: [birth year]  Date of referral:  04/10/18               Reason for consult:  Substance Use/ETOH Abuse                Permission sought to share information with:    Permission granted to share information::     Name::        Agency::     Relationship::     Contact Information:     Housing/Transportation Living arrangements for the past 2 months:  Apartment Source of Information:  Patient Patient Interpreter Needed:  None Criminal Activity/Legal Involvement Pertinent to Current Situation/Hospitalization:  No - Comment as needed Significant Relationships:  Adult Children, Friend Lives with:  Self Do you feel safe going back to the place where you live?  Yes Need for family participation in patient care:  No (Coment)  Care giving concerns: Patient from home independently. Patient on CIWA, history of alcohol use. CSW consulted for substance use resources.   Social Worker assessment / plan: CSW met with patient at bedside. Patient alert and oriented. CSW introduced self and role and discussed reason for consult. CSW attempted to assess patient substance use and requested permission to discuss patient's history of use. Patient declined to discuss. CSW offered information on community support and resources for substance use treatment. Patient declined and declined to elaborate. CSW will sign off, as no additional needs identified. Please re-consult if needed.  Employment status:    Insurance information:  Medicaid In [place] PT Recommendations:  No Follow Up Information / Referral to community resources:     Patient/Family's Response to care: Patient declining resources.  Patient/Family's Understanding of and Emotional Response to Diagnosis, Current Treatment, and Prognosis: Patient with questions about her condition, acknowledged GI bleed. Patient declining resources for substance  use.  Emotional Assessment Appearance:  Appears stated age Attitude/Demeanor/Rapport:  Engaged Affect (typically observed):  Flat, Guarded Orientation:  Oriented to Self, Oriented to Place, Oriented to  Time, Oriented to Situation Alcohol / Substance use:  Alcohol Use Psych involvement (Current and /or in the community):  No (Comment)  Discharge Needs  Concerns to be addressed:  Substance Abuse Concerns Readmission within the last 30 days:  Yes Current discharge risk:  Substance Abuse Barriers to Discharge:  Continued Medical Work up   Estanislado Emms, LCSW 04/10/2018, 3:02 PM

## 2018-04-10 NOTE — Progress Notes (Signed)
Laurie Lynch  ZOX:[id number] DOB: 01/07/1961 DOA: 04/03/2018 PCP: Madison Hickman, MD    Brief Narrative:  57 y.o. female with a hx of chronic atrial fibrillation on Eliquis, depression with anxiety, COPD, and alcoholism who presented to the ED w/ periumbilical abdominal pain and melena. Patient was admitted to a [facility] hospital late last month and discharged several days prior to this admit after treatment for acute kidney injury and alcohol-related complications. She was advised to stop Lasix and reduce diltiazem due to low BP's, and to stop Eliquis given recurrent falls and ongoing alcoholism. After returning home she noted general malaise with poor appetite, tenderness in the periumbilical region, and black tarry stools.  She admitted to ongoing daily alcohol use.  She called EMS, was found to be in atrial fibrillation with rapid rate, given a dose of Lopressor and fentanyl, and brought to the ED. In the ED EKG featured atrial fibrillation w/ RVR. CT abd/pelvis was negative for acute findings but notable for severe hepatic steatosis.  CBC was unremarkable.  DRE revealed brown stool that was FOBT positive.    Subjective: Patient without shortness of breath or chest pain.  Still complains of feeling fatigued.   Assessment & Plan:  Chronic Atrial fibrillation with acute RVR She apparently had been noncompliant with her medications.  CHADS-VASc is 6 and she was on Eliquis at home.  Compliance is unclear.  This has been reinitiated.  Does not appear to be a good long-term candidate for anticoagulation due to compliance issues as well as alcoholism.  Will defer to her primary care provider. TSH is not low.  Cardiology is following.  She remains on beta-blocker.  Cardizem was added yesterday.  Heart rate is better controlled.  Telemetry does show occasional episodes of tachyarrhythmia.  To be changed over to long-acting Cardizem today.  Hyperkalemia/hypomagnesemia Potassium and magnesium levels have  improved.  She will be given additional doses today.    Acute on Chronic right-sided heart failure due to moderate to severe pulmonary hypertension, chronic combined systolic and diastolic heart failure 09/2017 TTE noted EF 45-50% -as mostly upper extremity edema (likely oncotic), likely due to combination of severe pulmonary hypertension along with severe chronic hypoalbuminemia due to alcohol abuse and poor oral intake, placed on protein supplementation, counseled to quit alcohol.  Patient placed on IV Lasix.  Patient has diuresed well.  She is now on room air.  She was transitioned to oral furosemide.  Hypoglycemia No hx of DM - A1c 5.04 October 2017.  This is most likely secondary to alcoholism. Had stable random cortisol, TSH, BHOB, appropriately low C peptide, proinsulin 3.9.  Insulin 1.3.  Pending sulfonylurea.  CBGs have been stable.  Encourage oral intake.  Alcoholism with early alcohol withdrawal/abnormal LFTs Patient acknowledged long hx of excessive daily drinking  counseled to quit, refuses any help or referral to AA, was in early DTs.  Symptoms are better.  Now appears to be mainly anxious.  No signs of withdrawal noted at this time.  Did have mildly abnormal LFTs which are stable now.    Moderate PCM with hypoalbuminemia Albumin 2.6 on admission - secondary to alcoholism with liver disease and poor nutrition, placed on pro-stat and ensure.   Occult GI bleeding Brown stool on DRE that is FOBT positive.  Hemoglobin has been stable.  Needs outpatient GI follow-up.  Would be hesitant to continue long-term anticoagulation for this reason as well.  Depression with anxiety Continue Cymbalta, Buspar.  Holding trazodone.  Abdominal  pain CT negative for acute findings - appears to be a chronic issue based on review of EMR, exam is benign.  Possible gastritis.  Continue PPI.    COPD Well compensated at this time.   Borderline elevated TSH FT4 is normal - this is sick euthyroid  syndrome.  No further work-up.  PCP to repeat TSH in 4 to 6 weeks outpatient.   DVT prophylaxis: SCDs/Eliquis Code Status: FULL CODE Family Communication: Discussed with patient Disposition Plan: Management as outlined above.  Discharge when cleared by cardiology.  Reevaluated by PT.  Seems to have improved.  Does not need short-term rehab.    Consultants:  Cardiology   Anti-infectives (From admission, onward)   Start     Dose/Rate Route Frequency Ordered Stop   04/04/18 0700  vancomycin (VANCOCIN) 1,250 mg in sodium chloride 0.9 % 250 mL IVPB  Status:  Discontinued     1,250 mg 166.7 mL/hr over 90 Minutes Intravenous Every 12 hours 04/03/18 1753 04/03/18 2226   04/03/18 2300  piperacillin-tazobactam (ZOSYN) IVPB 3.375 g  Status:  Discontinued     3.375 g 12.5 mL/hr over 240 Minutes Intravenous Every 8 hours 04/03/18 1753 04/03/18 2226   04/03/18 1615  piperacillin-tazobactam (ZOSYN) IVPB 3.375 g     3.375 g 100 mL/hr over 30 Minutes Intravenous  Once 04/03/18 1611 04/03/18 1818   04/03/18 1615  vancomycin (VANCOCIN) 2,000 mg in sodium chloride 0.9 % 500 mL IVPB     2,000 mg 250 mL/hr over 120 Minutes Intravenous  Once 04/03/18 1611 04/03/18 2247      Objective:  Vitals:   04/10/18 0733 04/10/18 1136  BP: 124/64 (!) 159/103  Pulse: 84 89  Resp: 18 17  Temp: (!) 97.5 F (36.4 C) 98.4 F (36.9 C)  SpO2:  97%     Examination:  Awake alert.  In no distress Lungs are clear to auscultation bilaterally.  Normal effort S1-S2 is irregularly irregular.  Telemetry reviewed.  Heart rate mostly in the 80s to 90s now.  Transient episodes of tachycardia up to 140s 150s. Abdomen remains soft.  Vaguely tender without any rebound rigidity or guarding. Edema in both lower extremities has improved.   Alert and oriented x3.  No focal neurological deficits.    CBC: Recent Labs  Lab 04/03/18 1704  04/05/18 0336 04/06/18 0258 04/08/18 0706  WBC 5.5   < > 4.9 5.0 6.5  NEUTROABS  3.3  --   --   --   --   HGB 12.3   < > 10.7* 11.9* 12.9  HCT 37.8   < > 33.9* 37.9 39.3  MCV 95.9   < > 98.3 100.5* 96.1  PLT 352   < > 291 345 255   < > = values in this interval not displayed.   Basic Metabolic Panel: Recent Labs  Lab 04/05/18 0336  04/08/18 0706 04/09/18 0507 04/10/18 0545  NA 141   < > 142 141 140  K 4.4   < > 3.4* 3.5 3.6  CL 104   < > 96* 97* 97*  CO2 27   < > 33* 32 31  GLUCOSE 116*   < > 86 83 90  BUN 6   < > 8 7 8   CREATININE 0.77   < > 0.86 0.74 0.77  CALCIUM 8.0*   < > 8.4* 8.2* 8.8*  MG 2.0   < > 1.5* 1.6* 1.8  PHOS 4.0  --   --   --   --    < > =  values in this interval not displayed.   GFR: Estimated Creatinine Clearance: 87.9 mL/min (by C-G formula based on SCr of 0.77 mg/dL).  Liver Function Tests: Recent Labs  Lab 04/06/18 0258 04/07/18 0712 04/08/18 0706 04/09/18 0507  AST 327* 228* 131* 86*  ALT 69* 70* 50* 38  ALKPHOS 136* 147* 134* 130*  BILITOT 1.4* 1.0 1.1 0.8  PROT 4.9* 5.6* 5.4* 5.4*  ALBUMIN 2.4* 2.5* 2.5* 2.4*   Recent Labs  Lab 04/03/18 1704  LIPASE 27   Lab Results  Component Value Date   TSH 4.803 (H) 04/03/2018     Cardiac Enzymes: Recent Labs  Lab 04/03/18 1704  TROPONINI <0.03    HbA1C: Hgb A1c MFr Bld  Date/Time Value Ref Range Status  04/03/2018 11:49 PM 5.2 4.8 - 5.6 % Final    Comment:    (NOTE) Pre diabetes:          5.7%-6.4% Diabetes:              >6.4% Glycemic control for   <7.0% adults with diabetes     CBG: Recent Labs  Lab 04/09/18 1709 04/09/18 2050 04/09/18 2348 04/10/18 0416 04/10/18 0732  GLUCAP 75 86 106* 87 100*    Recent Results (from the past 240 hour(s))  Blood culture (routine x 2)     Status: None   Collection Time: 04/03/18  4:12 PM  Result Value Ref Range Status   Specimen Description BLOOD RIGHT ARM  Final   Special Requests   Final    BOTTLES DRAWN AEROBIC AND ANAEROBIC Blood Culture adequate volume   Culture   Final    NO GROWTH 5 DAYS Performed  at [facility], [address], [place], [place] [address]    Report Status 04/08/2018 FINAL  Final  MRSA PCR Screening     Status: None   Collection Time: 04/03/18 11:59 PM  Result Value Ref Range Status   MRSA by PCR NEGATIVE NEGATIVE Final    Comment:        The GeneXpert MRSA Assay (FDA approved for NASAL specimens only), is one component of a comprehensive MRSA colonization surveillance program. It is not intended to diagnose MRSA infection nor to guide or monitor treatment for MRSA infections. Performed at [facility], [address] [place] [address], [place], [place] [address]   C difficile quick scan w PCR reflex     Status: None   Collection Time: 04/05/18 11:23 PM  Result Value Ref Range Status   C Diff antigen NEGATIVE NEGATIVE Final   C Diff toxin NEGATIVE NEGATIVE Final   C Diff interpretation No C. difficile detected.  Final    Comment: Performed at [facility], [address], [place], [place] [address]     Scheduled Meds: . apixaban  5 mg Oral BID  . busPIRone  30 mg Oral BID  . chlordiazePOXIDE  10 mg Oral BID  . diltiazem  30 mg Oral Q8H  . DULoxetine  60 mg Oral Daily  . feeding supplement (ENSURE ENLIVE)  237 mL Oral BID BM  . feeding supplement (PRO-STAT SUGAR FREE 64)  30 mL Oral TID WC  . folic acid  1 mg Oral Daily  . furosemide  40 mg Oral Daily  . gabapentin  600 mg Oral TID  . magnesium oxide  400 mg Oral BID  . metoprolol succinate  150 mg Oral Daily  . multivitamin with minerals  1 tablet Oral Daily  . pantoprazole  40 mg Oral BID AC  . roflumilast  500 mcg Oral Daily  . sodium chloride flush  3 mL Intravenous Q12H  . vitamin B-1  250 mg Oral Daily  . vitamin B-12  500 mcg Oral Daily   Continuous Infusions:    LOS: 6 days   Signature  Osvaldo Shipper M.D on 04/10/2018 at 9:54 AM  To page go to [email or web address] - password [facility]

## 2018-04-10 NOTE — Progress Notes (Signed)
OT Cancellation Note  Patient Details Name: Laurie Lynch MRN: [id number] DOB: 06/02/1961   Cancelled Treatment:    Reason Eval/Treat Not Completed: Other (comment). Pt drowsy in bed, reports she would rather wait until after lunch to be seen. Told her I was not sure if I could make it back later today, she still wanted to wait. Spoke with PT earlier today as well as read their note with pt steadier on feet but limiting her own mobility.  Evette Georges [phone] 04/10/2018, 12:08 PM

## 2018-04-11 ENCOUNTER — Encounter (HOSPITAL_COMMUNITY): Payer: Self-pay | Admitting: Gastroenterology

## 2018-04-11 DIAGNOSIS — F10220 Alcohol dependence with intoxication, uncomplicated: Secondary | ICD-10-CM

## 2018-04-11 DIAGNOSIS — R195 Other fecal abnormalities: Secondary | ICD-10-CM

## 2018-04-11 DIAGNOSIS — E876 Hypokalemia: Secondary | ICD-10-CM

## 2018-04-11 LAB — GLUCOSE, CAPILLARY
GLUCOSE-CAPILLARY: 77 mg/dL (ref 70–99)
Glucose-Capillary: 94 mg/dL (ref 70–99)
Glucose-Capillary: 85 mg/dL (ref 70–99)
Glucose-Capillary: 83 mg/dL (ref 70–99)
Glucose-Capillary: 108 mg/dL — ABNORMAL HIGH (ref 70–99)

## 2018-04-11 LAB — BASIC METABOLIC PANEL
Anion gap: 13 (ref 5–15)
BUN: 7 mg/dL (ref 6–20)
CO2: 30 mmol/L (ref 22–32)
Calcium: 8.6 mg/dL — ABNORMAL LOW (ref 8.9–10.3)
Chloride: 98 mmol/L (ref 98–111)
Creatinine, Ser: 0.75 mg/dL (ref 0.44–1.00)
Glucose, Bld: 95 mg/dL (ref 70–99)
POTASSIUM: 3.2 mmol/L — AB (ref 3.5–5.1)
SODIUM: 141 mmol/L (ref 135–145)

## 2018-04-11 LAB — MAGNESIUM: MAGNESIUM: 1.7 mg/dL (ref 1.7–2.4)

## 2018-04-11 MED ORDER — POTASSIUM CHLORIDE CRYS ER 20 MEQ PO TBCR
40.0000 meq | EXTENDED_RELEASE_TABLET | Freq: Once | ORAL | Status: AC
  Administered 2018-04-11: 40 meq via ORAL
  Filled 2018-04-11: qty 2

## 2018-04-11 NOTE — Progress Notes (Signed)
Laurie Lynch  ZOX:[id number] DOB: 08-Feb-1961 DOA: 04/03/2018 PCP: No primary care provider on file.    Brief Narrative:  57 y.o. female with a hx of chronic atrial fibrillation on Eliquis, depression with anxiety, COPD, and alcoholism who presented to the ED w/ periumbilical abdominal pain and melena. Patient was admitted to a [facility] hospital late last month and discharged several days prior to this admit after treatment for acute kidney injury and alcohol-related complications. She was advised to stop Lasix and reduce diltiazem due to low BP's, and to stop Eliquis given recurrent falls and ongoing alcoholism. After returning home she noted general malaise with poor appetite, tenderness in the periumbilical region, and black tarry stools.  She admitted to ongoing daily alcohol use.  She called EMS, was found to be in atrial fibrillation with rapid rate, given a dose of Lopressor and fentanyl, and brought to the ED. In the ED EKG featured atrial fibrillation w/ RVR. CT abd/pelvis was negative for acute findings but notable for severe hepatic steatosis.  CBC was unremarkable.  DRE revealed brown stool that was FOBT positive.    Subjective: Patient denies any chest pain.  Continues to have fatigue.   Assessment & Plan:  Chronic Atrial fibrillation with acute RVR She apparently had been noncompliant with her medications.  CHADS-VASc is 6 and she was on Eliquis at home.  Compliance is unclear.  Looks like cardiology has discussed this further with patient.  Her heart rate remains poorly controlled and they are considering TEE and cardioversion.  So they feel that she may benefit from anticoagulation for now.  They recommend GI work-up due to her heme positive stools.  TSH is not low.  She remains on beta-blocker.  Cardizem was added.  Heart rate remains poorly controlled.  Further management per cardiology.  Telemetry shows heart rate in the 120s, A. fib.   Hyperkalemia/hypomagnesemia Supplement  potassium.  Magnesium level is 1.7.    Acute on Chronic right-sided heart failure due to moderate to severe pulmonary hypertension, chronic combined systolic and diastolic heart failure 09/2017 TTE noted EF 45-50% -as mostly upper extremity edema (likely oncotic), likely due to combination of severe pulmonary hypertension along with severe chronic hypoalbuminemia due to alcohol abuse and poor oral intake, placed on protein supplementation, counseled to quit alcohol.  Patient placed on IV Lasix.  Patient has diuresed well.  She is now on room air.  She was transitioned to oral furosemide.  Hypoglycemia No hx of DM - A1c 5.04 October 2017.  This is most likely secondary to alcoholism. Had stable random cortisol, TSH, BHOB, appropriately low C peptide, proinsulin 3.9.  Insulin 1.3.  Pending sulfonylurea.  CBGs have been stable.  Encourage oral intake.  Alcoholism with early alcohol withdrawal/abnormal LFTs Patient acknowledged long hx of excessive daily drinking  counseled to quit, refuses any help or referral to AA, was in early DTs.  Symptoms are better.  Now appears to be mainly anxious.  No signs of withdrawal noted at this time.  Did have mildly abnormal LFTs which are stable now.    Moderate PCM with hypoalbuminemia Albumin 2.6 on admission - secondary to alcoholism with liver disease and poor nutrition, placed on pro-stat and ensure.   Occult GI bleeding Brown stool on DRE that is FOBT positive.  Hemoglobin has been stable.  Initially plan was for outpatient GI work-up.  But cardiology now considering TEE and cardioversion.  So she will need to be on anticoagulation.  Hence they are  recommending GI work-up.  Discussed with Dr. Ewing Schlein who will evaluate patient.  Put her on clear liquid diet.  We will hold the Eliquis for now as plan is for endoscopy tomorrow.  Depression with anxiety Continue Cymbalta, Buspar.  Holding trazodone.  Abdominal pain CT negative for acute findings - appears to be a  chronic issue based on review of EMR, exam is benign.  Possible gastritis.  Continue PPI.    COPD Well compensated at this time.   Borderline elevated TSH FT4 is normal - this is sick euthyroid syndrome.  No further work-up.  PCP to repeat TSH in 4 to 6 weeks outpatient.   DVT prophylaxis: SCDs/Eliquis Code Status: FULL CODE Family Communication: Discussed with patient Disposition Plan: As above.  Heart rate still poorly controlled.  GI to do upper endoscopy tomorrow.  Holding her Eliquis for this purpose.  Initially PT was recommending skilled nursing facility but now plan is for her to go home with home health when medically ready.  Consultants:  Cardiology   Anti-infectives (From admission, onward)   Start     Dose/Rate Route Frequency Ordered Stop   04/04/18 0700  vancomycin (VANCOCIN) 1,250 mg in sodium chloride 0.9 % 250 mL IVPB  Status:  Discontinued     1,250 mg 166.7 mL/hr over 90 Minutes Intravenous Every 12 hours 04/03/18 1753 04/03/18 2226   04/03/18 2300  piperacillin-tazobactam (ZOSYN) IVPB 3.375 g  Status:  Discontinued     3.375 g 12.5 mL/hr over 240 Minutes Intravenous Every 8 hours 04/03/18 1753 04/03/18 2226   04/03/18 1615  piperacillin-tazobactam (ZOSYN) IVPB 3.375 g     3.375 g 100 mL/hr over 30 Minutes Intravenous  Once 04/03/18 1611 04/03/18 1818   04/03/18 1615  vancomycin (VANCOCIN) 2,000 mg in sodium chloride 0.9 % 500 mL IVPB     2,000 mg 250 mL/hr over 120 Minutes Intravenous  Once 04/03/18 1611 04/03/18 2247      Objective:  Vitals:   04/11/18 0854 04/11/18 1124  BP: 117/63 137/88  Pulse: 97 84  Resp: 19 18  Temp:  98.3 F (36.8 C)  SpO2: 100% 95%     Examination:  Awake alert.  In no distress Lungs are clear to auscultation bilaterally.  Normal effort. S1-S2 remains irregularly irregular.  Tachycardic.  No S3-S4.  Telemetry shows heart rate in the 120s this morning.. Abdomen remains soft.  Vague tenderness is present.  No rebound  rigidity or guarding. Edema has improved in the upper and lower extremities. Alert and oriented x3.  No focal neurological deficits.    CBC: Recent Labs  Lab 04/05/18 0336 04/06/18 0258 04/08/18 0706  WBC 4.9 5.0 6.5  HGB 10.7* 11.9* 12.9  HCT 33.9* 37.9 39.3  MCV 98.3 100.5* 96.1  PLT 291 345 255   Basic Metabolic Panel: Recent Labs  Lab 04/05/18 0336  04/09/18 0507 04/10/18 0545 04/11/18 0717  NA 141   < > 141 140 141  K 4.4   < > 3.5 3.6 3.2*  CL 104   < > 97* 97* 98  CO2 27   < > 32 31 30  GLUCOSE 116*   < > 83 90 95  BUN 6   < > 7 8 7   CREATININE 0.77   < > 0.74 0.77 0.75  CALCIUM 8.0*   < > 8.2* 8.8* 8.6*  MG 2.0   < > 1.6* 1.8 1.7  PHOS 4.0  --   --   --   --    < > =  values in this interval not displayed.   GFR: Estimated Creatinine Clearance: 87.5 mL/min (by C-G formula based on SCr of 0.75 mg/dL).  Liver Function Tests: Recent Labs  Lab 04/06/18 0258 04/07/18 0712 04/08/18 0706 04/09/18 0507  AST 327* 228* 131* 86*  ALT 69* 70* 50* 38  ALKPHOS 136* 147* 134* 130*  BILITOT 1.4* 1.0 1.1 0.8  PROT 4.9* 5.6* 5.4* 5.4*  ALBUMIN 2.4* 2.5* 2.5* 2.4*    Lab Results  Component Value Date   TSH 4.803 (H) 04/03/2018    HbA1C: Hgb A1c MFr Bld  Date/Time Value Ref Range Status  04/03/2018 11:49 PM 5.2 4.8 - 5.6 % Final    Comment:    (NOTE) Pre diabetes:          5.7%-6.4% Diabetes:              >6.4% Glycemic control for   <7.0% adults with diabetes     CBG: Recent Labs  Lab 04/10/18 1617 04/10/18 2047 04/10/18 2340 04/11/18 0743 04/11/18 1123  GLUCAP 95 82 77 94 85    Recent Results (from the past 240 hour(s))  Blood culture (routine x 2)     Status: None   Collection Time: 04/03/18  4:12 PM  Result Value Ref Range Status   Specimen Description BLOOD RIGHT ARM  Final   Special Requests   Final    BOTTLES DRAWN AEROBIC AND ANAEROBIC Blood Culture adequate volume   Culture   Final    NO GROWTH 5 DAYS Performed at [facility], [address], [place], [place] [address]    Report Status 04/08/2018 FINAL  Final  MRSA PCR Screening     Status: None   Collection Time: 04/03/18 11:59 PM  Result Value Ref Range Status   MRSA by PCR NEGATIVE NEGATIVE Final    Comment:        The GeneXpert MRSA Assay (FDA approved for NASAL specimens only), is one component of a comprehensive MRSA colonization surveillance program. It is not intended to diagnose MRSA infection nor to guide or monitor treatment for MRSA infections. Performed at [facility], [address] [place], [place] [address]   C difficile quick scan w PCR reflex     Status: None   Collection Time: 04/05/18 11:23 PM  Result Value Ref Range Status   C Diff antigen NEGATIVE NEGATIVE Final   C Diff toxin NEGATIVE NEGATIVE Final   C Diff interpretation No C. difficile detected.  Final    Comment: Performed at [facility], [address], [place], [place] [address]     Scheduled Meds: . busPIRone  30 mg Oral BID  . chlordiazePOXIDE  10 mg Oral BID  . diltiazem  30 mg Oral Q8H  . DULoxetine  60 mg Oral Daily  . feeding supplement (ENSURE ENLIVE)  237 mL Oral BID BM  . folic acid  1 mg Oral Daily  . furosemide  40 mg Oral Daily  . gabapentin  600 mg Oral TID  . LORazepam  0.5 mg Oral Once  . magnesium oxide  400 mg Oral BID  . metoprolol succinate  100 mg Oral BID  . multivitamin with minerals  1 tablet Oral Daily  . pantoprazole  40 mg Oral BID AC  . roflumilast  500 mcg Oral Daily  . sodium chloride flush  3 mL Intravenous Q12H  . vitamin B-1  250 mg Oral Daily  . vitamin B-12  500  mcg Oral Daily   Continuous Infusions:    LOS: 7 days   Signature  Osvaldo Shipper M.D on 04/11/2018 at 1:10 PM  To page go to [email or web address] - password [email or web address]

## 2018-04-11 NOTE — Anesthesia Preprocedure Evaluation (Addendum)
Anesthesia Evaluation  Patient identified by MRN, date of birth, ID band  Reviewed: Allergy & Precautions, NPO status , Patient's Chart, lab work & pertinent test results  Airway Mallampati: III  TM Distance: >3 FB Neck ROM: Full  Mouth opening: Limited Mouth Opening  Dental no notable dental hx. (+) Chipped, Dental Advisory Given,    Pulmonary neg pulmonary ROS, COPD,    Pulmonary exam normal breath sounds clear to auscultation       Cardiovascular hypertension, Pt. on home beta blockers and Pt. on medications + CAD, + CABG and +CHF  negative cardio ROS Normal cardiovascular exam+ dysrhythmias (on eliquis) Atrial Fibrillation  Rhythm:Irregular Rate:Normal  09/2017 TTE EF 45-50% mild-moderate (1-2+) mitral regurgitation. moderate (2+) tricuspid regurgitation. Right ventricular systolic pressure is elevated between 50-83m Hg, consistent with moderately severe pulmonary hypertension.    Neuro/Psych Depression negative neurological ROS     GI/Hepatic negative GI ROS, (+)     substance abuse  alcohol use, GI bleed   Endo/Other  negative endocrine ROS  Renal/GU negative Renal ROS     Musculoskeletal  (+) Arthritis , Osteoarthritis,    Abdominal   Peds  Hematology negative hematology ROS (+)   Anesthesia Other Findings Day of surgery medications reviewed with the patient.  Presented in afib with RVR, HR still 120s, now with heme positive stools  Reproductive/Obstetrics                            Anesthesia Physical Anesthesia Plan  ASA: III  Anesthesia Plan: MAC   Post-op Pain Management:    Induction: Intravenous  PONV Risk Score and Plan: 2 and Treatment may vary due to age or medical condition  Airway Management Planned: Simple Face Mask  Additional Equipment:   Intra-op Plan:   Post-operative Plan:   Informed Consent: I have reviewed the patients History and Physical,  chart, labs and discussed the procedure including the risks, benefits and alternatives for the proposed anesthesia with the patient or authorized representative who has indicated his/her understanding and acceptance.   Dental advisory given  Plan Discussed with: CRNA  Anesthesia Plan Comments:        Anesthesia Quick Evaluation

## 2018-04-11 NOTE — Progress Notes (Addendum)
Pt's heart rate elevates to 150's-170's when she gets up to go to the bathroom and returns to baseline (120's) when she gets back in bed. Pt SOB at time of tachycardia. 10mg  PRN cardizem IV was given. Will continue to monitor.

## 2018-04-11 NOTE — Progress Notes (Signed)
Physical Therapy Treatment & Discharge Patient Details Name: Laurie Lynch MRN: [id number] DOB: November 01, 1960 Today's Date: 04/11/2018    History of Present Illness Pt is a 57 y.o. female admitted 04/03/18 with abdominal pain and melena; found to have a-fib with RVR and acute on chronic R-side heart failure. Early signs of ETOH withdrawal placed on CIWA. Of note, recent admit to [facility] with AKI and alcohol-related probkems. PMH includes ETOH abuse, COPD, depression, chronic pain.   PT Comments    Pt progressing well with mobility. Indep with transfers, ambulation and ADLs. Limited by HR up to 149 with mobility (RN notified); pt reports "feeling my heart beating fast" when HR increased. SpO2 >94% on RA during session. Discussed energy conservation and importance of mobility. Has met short-term acute PT goals. Pt has no further questions or concerns. Will d/c acute PT.   Follow Up Recommendations  No PT follow up     Equipment Recommendations  None recommended by PT    Recommendations for Other Services       Precautions / Restrictions Precautions Precautions: Fall;Other (comment) Precaution Comments: tachycardia- watch HR Restrictions Weight Bearing Restrictions: No    Mobility  Bed Mobility Overal bed mobility: Independent                Transfers Overall transfer level: Independent Equipment used: None Transfers: Sit to/from Stand              Ambulation/Gait Ambulation/Gait assistance: Modified independent (Device/Increase time)(increased time) Gait Distance (Feet): 150 Feet Assistive device: None Gait Pattern/deviations: Step-through pattern;Decreased stride length Gait velocity: Decreased Gait velocity interpretation: 1.31 - 2.62 ft/sec, indicative of limited community ambulator General Gait Details: Slow, steady amb; further distance limited by HR 130-149 with ambulation   Stairs             Wheelchair Mobility    Modified Rankin (Stroke  Patients Only)       Balance Overall balance assessment: No apparent balance deficits (not formally assessed)                                          Cognition Arousal/Alertness: Awake/alert Behavior During Therapy: Flat affect Overall Cognitive Status: Within Functional Limits for tasks assessed                                        Exercises      General Comments        Pertinent Vitals/Pain Pain Assessment: Faces Faces Pain Scale: Hurts little more Pain Location: Generalized Pain Descriptors / Indicators: Discomfort;Dull Pain Intervention(s): Monitored during session    Home Living                      Prior Function            PT Goals (current goals can now be found in the care plan section) Acute Rehab PT Goals PT Goal Formulation: All assessment and education complete, DC therapy Progress towards PT goals: Goals met/education completed, patient discharged from PT    Frequency    Min 3X/week      PT Plan Current plan remains appropriate    Co-evaluation              AM-PAC PT "6 Clicks" Daily Activity  Outcome Measure  Difficulty turning over in bed (including adjusting bedclothes, sheets and blankets)?: None Difficulty moving from lying on back to sitting on the side of the bed? : None Difficulty sitting down on and standing up from a chair with arms (e.g., wheelchair, bedside commode, etc,.)?: None Help needed moving to and from a bed to chair (including a wheelchair)?: None Help needed walking in hospital room?: None Help needed climbing 3-5 steps with a railing? : A Little 6 Click Score: 23    End of Session Equipment Utilized During Treatment: Gait belt Activity Tolerance: Patient limited by fatigue Patient left: in bed;with call bell/phone within reach Nurse Communication: Mobility status PT Visit Diagnosis: Other abnormalities of gait and mobility (R26.89);Muscle weakness (generalized)  (M62.81)     Time: 4753-3917 PT Time Calculation (min) (ACUTE ONLY): 23 min  Charges:  $Gait Training: 8-22 mins $Self Care/Home Management: [place], PT, DPT Acute Rehabilitation Services  Pager [phone] Office [place] 04/11/2018, 10:34 AM

## 2018-04-11 NOTE — Progress Notes (Signed)
Progress Note  Patient Name: Laurie Lynch Date of Encounter: 04/11/2018  Primary Cardiologist: Rollene Rotunda, MD  Subjective   Pt resting comfortably. Denies any symptoms at rest but notes increased HR and exertional dyspnea with ambulation. No chest pain.   Inpatient Medications    Scheduled Meds: . apixaban  5 mg Oral BID  . busPIRone  30 mg Oral BID  . chlordiazePOXIDE  10 mg Oral BID  . diltiazem  30 mg Oral Q8H  . DULoxetine  60 mg Oral Daily  . feeding supplement (ENSURE ENLIVE)  237 mL Oral BID BM  . feeding supplement (PRO-STAT SUGAR FREE 64)  30 mL Oral TID WC  . folic acid  1 mg Oral Daily  . furosemide  40 mg Oral Daily  . gabapentin  600 mg Oral TID  . LORazepam  0.5 mg Oral Once  . magnesium oxide  400 mg Oral BID  . metoprolol succinate  100 mg Oral BID  . multivitamin with minerals  1 tablet Oral Daily  . pantoprazole  40 mg Oral BID AC  . roflumilast  500 mcg Oral Daily  . sodium chloride flush  3 mL Intravenous Q12H  . vitamin B-1  250 mg Oral Daily  . vitamin B-12  500 mcg Oral Daily   Continuous Infusions:  PRN Meds: acetaminophen **OR** acetaminophen, albuterol, diltiazem, HYDROcodone-acetaminophen, LORazepam, metoprolol tartrate, naphazoline-glycerin, [DISCONTINUED] ondansetron **OR** ondansetron (ZOFRAN) IV, senna-docusate   Vital Signs    Vitals:   04/11/18 0523 04/11/18 0744 04/11/18 0854 04/11/18 1124  BP: 98/63 (!) 150/89 117/63 137/88  Pulse:  (!) 116 97 84  Resp: 15 17 19 18   Temp: 98.3 F (36.8 C) 98.3 F (36.8 C)  98.3 F (36.8 C)  TempSrc: Oral Oral  Oral  SpO2: 100% 100% 100% 95%  Weight: 81.5 kg     Height:        Intake/Output Summary (Last 24 hours) at 04/11/2018 1145 Last data filed at 04/10/2018 1700 Gross per 24 hour  Intake 240 ml  Output -  Net 240 ml   Filed Weights   04/09/18 0500 04/10/18 0300 04/11/18 0523  Weight: 86.8 kg 82.1 kg 81.5 kg    Telemetry    Afib. Resting HR in the 80s-90s, HR jumps into  the 140s-150s with ambulation - Personally Reviewed  ECG    Not performed today - Personally Reviewed  Physical Exam   GEN: WM appearing much older than actual age, No acute distress.   Neck: No JVD Cardiac: irregularly irregular rhythm, normal rate at rest, no murmurs, rubs, or gallops.  Respiratory: Clear to auscultation bilaterally. GI: Soft, nontender, non-distended  MS: trace LE edema; No deformity. Neuro:  Nonfocal  Psych: Normal affect   Labs    Chemistry Recent Labs  Lab 04/07/18 0712 04/08/18 0706 04/09/18 0507 04/10/18 0545 04/11/18 0717  NA 142 142 141 140 141  K 3.9 3.4* 3.5 3.6 3.2*  CL 98 96* 97* 97* 98  CO2 32 33* 32 31 30  GLUCOSE 72 86 83 90 95  BUN 9 8 7 8 7   CREATININE 0.77 0.86 0.74 0.77 0.75  CALCIUM 8.4* 8.4* 8.2* 8.8* 8.6*  PROT 5.6* 5.4* 5.4*  --   --   ALBUMIN 2.5* 2.5* 2.4*  --   --   AST 228* 131* 86*  --   --   ALT 70* 50* 38  --   --   ALKPHOS 147* 134* 130*  --   --  BILITOT 1.0 1.1 0.8  --   --   GFRNONAA >60 >60 >60 >60 >60  GFRAA >60 >60 >60 >60 >60  ANIONGAP 12 13 12 12 13      Hematology Recent Labs  Lab 04/05/18 0336 04/06/18 0258 04/08/18 0706  WBC 4.9 5.0 6.5  RBC 3.45* 3.77* 4.09  HGB 10.7* 11.9* 12.9  HCT 33.9* 37.9 39.3  MCV 98.3 100.5* 96.1  MCH 31.0 31.6 31.5  MCHC 31.6 31.4 32.8  RDW 16.8* 16.5* 16.2*  PLT 291 345 255    Cardiac EnzymesNo results for input(s): TROPONINI in the last 168 hours. No results for input(s): TROPIPOC in the last 168 hours.   BNPNo results for input(s): BNP, PROBNP in the last 168 hours.   DDimer No results for input(s): DDIMER in the last 168 hours.   Radiology    No results found.  Cardiac Studies   2D Echo 10/03/2017 There is normal left ventricular wall thickness. The left ventricular ejection fraction is mildly reduced (45-50%). Septal motion is consistent with conduction abnormality. The aortic valve is trileaflet. Unable to adequately determine diastolic  dysfunction. The mitral valve leaflets appear thickened, but open well. There is mild-moderate (1-2+) mitral regurgitation. There is moderate (2+) tricuspid regurgitation. Right ventricular systolic pressure is elevated between 50-85mm Hg, consistent with moderately severe pulmonary hypertension. The left atrium is moderately dilated. The right atrium is mildly dilated.   Patient Profile     57 y.o.femalewith a PMH ofchronic atrial fibrillation on Eliquis,noncompliance, alcoholism on CIWA protocol, ?HFrEF (09/2017 EF 45-50%), frequent falls, COPD (prescribed home oxygen), depression and anxiety, who is being followed by cardiology for afib RVR.  Assessment & Plan    1. Atrial Fibrillation: rates are controlled resting in the 80s-90s but increase into the 140s-150s with ambulation. Metoprolol increased yesterday to 100 mg BID. She remains on Cardizem 30 mg Q8Hs. Pt is on Eliquis for a/c but given her ETOH history, she may not be a good candidate for long term anticoagulation, given bleed and fall risk. In addition, there were concerns regarding dark stools and + FOBT. Recommend GI w/u to help determine if ok to continue a/c at least short term. If so, she may benefit from TEE/guided DDCV and continuation of a/c for at least 1 month post cardioversion. Will defer to MD.   2. Acute on Chronic Combined Systolic and Diastolic HF: Echo 10/03/17 showed mildly reduced LVEF at 45-50%. Good diuresis thus far. Net I/Os negative 8.3L total. No edema on exam. Continue PO lasix, 40 mg daily.   3. HTN: controlled this morning at  137/88.  4. ETOH Abuse: management per IM. On CIWA protocol.   5. Hypokalemia: K 3.2 today. Will give 40 mEq of Kdur for supplementation.    For questions or updates, please contact [facility] Please consult [email or web address] for contact info under        Signed, Robbie Lis, PA-C  04/11/2018, 11:45 AM   Pager [phone]

## 2018-04-11 NOTE — Consult Note (Signed)
Reason for Consult:guaiac positive anemia Referring Physician: Hospital team  Laurie Lynch is an 57 y.o. female.  HPI: patient seen and examined and discussed with the hospital team and she's had abdominal pain since she's been here but only episodic GI complaints at home and is not on any GI medicines and her recent CT was negative except for diverticuli and she had a colonoscopy at [facility] in March 2018 but I am unable to access that report at care everywhere and she does not know the findings and she did see some black bowel movements at home and not with fresh blood and she's had multiple other CTs in the past at [facility] and does have an umbilical hernia and had that operated on before and she says that is bothering her some and she has no other specific complaints  Past Medical History:  Diagnosis Date  . Arthritis   . CHF (congestive heart failure) ([place])   . COPD (chronic obstructive pulmonary disease) ([place])   . Coronary artery disease   . Hypertension     Past Surgical History:  Procedure Laterality Date  . ABDOMINAL SURGERY    . 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  She reports CABG at [facility].  No records in [place]  . CORONARY ARTERY BYPASS GRAFT    . FRACTURE SURGERY      Family History  Problem Relation Age of Onset  . Sudden Cardiac Death Neg Hx     Social History:  reports that she has never smoked. She has never used smokeless tobacco. She reports that she drinks alcohol. She reports that she does not use drugs.  Allergies: No Known Allergies  Medications: I have reviewed the patient's current medications.  Results for orders placed or performed during the hospital encounter of 04/03/18 (from the past 48 hour(s))  Glucose, capillary     Status: None   Collection Time: 04/09/18  5:09 PM  Result Value Ref Range   Glucose-Capillary 75 70 - 99 mg/dL  Glucose,  capillary     Status: None   Collection Time: 04/09/18  8:50 PM  Result Value Ref Range   Glucose-Capillary 86 70 - 99 mg/dL  Glucose, capillary     Status: Abnormal   Collection Time: 04/09/18 11:48 PM  Result Value Ref Range   Glucose-Capillary 106 (H) 70 - 99 mg/dL  Glucose, capillary     Status: None   Collection Time: 04/10/18  4:16 AM  Result Value Ref Range   Glucose-Capillary 87 70 - 99 mg/dL  Basic metabolic panel     Status: Abnormal   Collection Time: 04/10/18  5:45 AM  Result Value Ref Range   Sodium 140 135 - 145 mmol/L   Potassium 3.6 3.5 - 5.1 mmol/L    Comment: HEMOLYSIS AT THIS LEVEL MAY AFFECT RESULT   Chloride 97 (L) 98 - 111 mmol/L   CO2 31 22 - 32 mmol/L   Glucose, Bld 90 70 - 99 mg/dL   BUN 8 6 - 20 mg/dL   Creatinine, Ser 0.77 0.44 - 1.00 mg/dL   Calcium 8.8 (L) 8.9 - 10.3 mg/dL   GFR calc non Af Amer >60 >60 mL/min   GFR calc Af Amer >60 >60 mL/min    Comment: (NOTE) The eGFR has been calculated using the CKD EPI equation. This calculation has not been validated in all clinical situations. eGFR's persistently <60 mL/min  signify possible Chronic Kidney Disease.    Anion gap 12 5 - 15    Comment: Performed at [facility] [address] [place], [place] [address]  Magnesium     Status: None   Collection Time: 04/10/18  5:45 AM  Result Value Ref Range   Magnesium 1.8 1.7 - 2.4 mg/dL    Comment: Performed at [facility] [address], [place], [place] [address]  Glucose, capillary     Status: Abnormal   Collection Time: 04/10/18  7:32 AM  Result Value Ref Range   Glucose-Capillary 100 (H) 70 - 99 mg/dL  Glucose, capillary     Status: None   Collection Time: 04/10/18 11:30 AM  Result Value Ref Range   Glucose-Capillary 97 70 - 99 mg/dL  Glucose, capillary     Status: None   Collection Time: 04/10/18  4:17 PM  Result Value Ref Range   Glucose-Capillary 95 70 - 99 mg/dL  Glucose, capillary     Status: None   Collection Time:  04/10/18  8:47 PM  Result Value Ref Range   Glucose-Capillary 82 70 - 99 mg/dL  Glucose, capillary     Status: None   Collection Time: 04/10/18 11:40 PM  Result Value Ref Range   Glucose-Capillary 77 70 - 99 mg/dL  Basic metabolic panel     Status: Abnormal   Collection Time: 04/11/18  7:17 AM  Result Value Ref Range   Sodium 141 135 - 145 mmol/L   Potassium 3.2 (L) 3.5 - 5.1 mmol/L   Chloride 98 98 - 111 mmol/L   CO2 30 22 - 32 mmol/L   Glucose, Bld 95 70 - 99 mg/dL   BUN 7 6 - 20 mg/dL   Creatinine, Ser 0.75 0.44 - 1.00 mg/dL   Calcium 8.6 (L) 8.9 - 10.3 mg/dL   GFR calc non Af Amer >60 >60 mL/min   GFR calc Af Amer >60 >60 mL/min    Comment: (NOTE) The eGFR has been calculated using the CKD EPI equation. This calculation has not been validated in all clinical situations. eGFR's persistently <60 mL/min signify possible Chronic Kidney Disease.    Anion gap 13 5 - 15    Comment: Performed at [facility] [address], [place], [place] [address]  Magnesium     Status: None   Collection Time: 04/11/18  7:17 AM  Result Value Ref Range   Magnesium 1.7 1.7 - 2.4 mg/dL    Comment: Performed at [facility] [address], [place], [place] [address]  Glucose, capillary     Status: None   Collection Time: 04/11/18  7:43 AM  Result Value Ref Range   Glucose-Capillary 94 70 - 99 mg/dL  Glucose, capillary     Status: None   Collection Time: 04/11/18 11:23 AM  Result Value Ref Range   Glucose-Capillary 85 70 - 99 mg/dL    No results found.  ROSnegative except above Blood pressure 137/88, pulse 84, temperature 98.3 F (36.8 C), temperature source Oral, resp. rate 18, height _0  (1.753 m), weight 81.5 kg, SpO2 95 %. Physical Examvital signs stable afebrile no acute distress exam pertinent for lungs clear regular rate and rhythm abdomen is soft nontenderCT reviewedalbumin low alkaline phosphatase minimally increased other liver tests okay CBC  okay  Assessment/Plan: Minimal anemia guaiac positivity Plan: The risks benefits methods of endoscopy was discussed with the patient and will proceed tomorrow but I do not think  she needs another colonoscopy although you may need to contact [facility] to get the actual report but CT reassuring  Burel Kahre E 04/11/2018, 3:36 PM

## 2018-04-11 NOTE — Progress Notes (Signed)
Nutrition Follow-up  DOCUMENTATION CODES:   Not applicable  INTERVENTION:    Vanilla Ensure Enlive 1 bottle mixed with vanilla ice cream BID between meals.   Continue MVI  D/C Pro-stat, patient refusing  NUTRITION DIAGNOSIS:   Inadequate oral intake related to (abdominal pain related to acute illness) as evidenced by meal completion < 50%.  Ongoing  GOAL:   Patient will meet greater than or equal to 90% of their needs  Unmet  MONITOR:   PO intake, Supplement acceptance, Labs  ASSESSMENT:   57 yo female with PMH of CHF, COPD, CAD, and alcoholism who was admitted on 9/2 with melena and abdominal pain.   Patient reports that she has been eating poorly because she does not like the food. She does not like the Ensure by itself, but willing to drink it if mixed with vanilla ice cream.    Labs reviewed. Potassium 3.2 (L)--receiving K-dur for supplementation Medications reviewed and include folic acid, lasix, mag-ox, MVI, thiamine, vitamin B-12.   Diet Order:   Diet Order            Diet clear liquid Room service appropriate? Yes; Fluid consistency: Thin  Diet effective now              EDUCATION NEEDS:   No education needs have been identified at this time  Skin:  Skin Assessment: Reviewed RN Assessment  Last BM:  9/2  Height:   Ht Readings from Last 1 Encounters:  04/04/18 5\' 9"  (1.753 m)    Weight:   Wt Readings from Last 1 Encounters:  04/11/18 81.5 kg    Ideal Body Weight:  65.9 kg  BMI:  Body mass index is 26.52 kg/m.  Estimated Nutritional Needs:   Kcal:  1700-1900  Protein:  75-90 gm  Fluid:  >/= 1.8 L    Joaquin Courts, RD, LDN, CNSC Pager [phone] After Hours Pager [phone]

## 2018-04-11 NOTE — Progress Notes (Signed)
Event note: notified by CCMD at 1925 patients HR sustaining 150's to 180's. Pt up in room trying to wash her clothing in the sink. Pt visibly short of breath and moderate tremors but with out other complaints. Metoprolol 5mg  IV given per PRN order and HR came down to 90's to low 100's. Will continue to monitor. Dierdre Highman, RN

## 2018-04-12 ENCOUNTER — Inpatient Hospital Stay (HOSPITAL_COMMUNITY): Payer: Medicaid Other | Admitting: Anesthesiology

## 2018-04-12 ENCOUNTER — Encounter (HOSPITAL_COMMUNITY)
Admission: EM | Discharge status: Against Medical Advice | Payer: Self-pay | Source: Home / Self Care | Attending: Internal Medicine

## 2018-04-12 ENCOUNTER — Inpatient Hospital Stay (HOSPITAL_COMMUNITY): Payer: Medicaid Other

## 2018-04-12 ENCOUNTER — Encounter (HOSPITAL_COMMUNITY): Payer: Self-pay | Admitting: Certified Registered"

## 2018-04-12 DIAGNOSIS — R52 Pain, unspecified: Secondary | ICD-10-CM

## 2018-04-12 DIAGNOSIS — I5033 Acute on chronic diastolic (congestive) heart failure: Secondary | ICD-10-CM

## 2018-04-12 HISTORY — PX: ESOPHAGOGASTRODUODENOSCOPY (EGD) WITH PROPOFOL: SHX5813

## 2018-04-12 LAB — MAGNESIUM: Magnesium: 1.7 mg/dL (ref 1.7–2.4)

## 2018-04-12 LAB — CBC
HCT: 39.7 % (ref 36.0–46.0)
Hemoglobin: 13.1 g/dL (ref 12.0–15.0)
MCH: 31.6 pg (ref 26.0–34.0)
MCHC: 33 g/dL (ref 30.0–36.0)
MCV: 95.7 fL (ref 78.0–100.0)
PLATELETS: 344 10*3/uL (ref 150–400)
RBC: 4.15 MIL/uL (ref 3.87–5.11)
RDW: 16.1 % — AB (ref 11.5–15.5)
WBC: 7.9 10*3/uL (ref 4.0–10.5)

## 2018-04-12 LAB — BASIC METABOLIC PANEL
ANION GAP: 9 (ref 5–15)
BUN: 7 mg/dL (ref 6–20)
CO2: 29 mmol/L (ref 22–32)
Calcium: 8.9 mg/dL (ref 8.9–10.3)
Chloride: 102 mmol/L (ref 98–111)
Creatinine, Ser: 0.82 mg/dL (ref 0.44–1.00)
GLUCOSE: 98 mg/dL (ref 70–99)
Potassium: 3.5 mmol/L (ref 3.5–5.1)
Sodium: 140 mmol/L (ref 135–145)

## 2018-04-12 LAB — GLUCOSE, CAPILLARY
GLUCOSE-CAPILLARY: 73 mg/dL (ref 70–99)
GLUCOSE-CAPILLARY: 83 mg/dL (ref 70–99)
GLUCOSE-CAPILLARY: 91 mg/dL (ref 70–99)
GLUCOSE-CAPILLARY: 94 mg/dL (ref 70–99)
Glucose-Capillary: 80 mg/dL (ref 70–99)
Glucose-Capillary: 97 mg/dL (ref 70–99)

## 2018-04-12 SURGERY — ESOPHAGOGASTRODUODENOSCOPY (EGD) WITH PROPOFOL
Anesthesia: Monitor Anesthesia Care

## 2018-04-12 MED ORDER — APIXABAN 5 MG PO TABS
5.0000 mg | ORAL_TABLET | Freq: Two times a day (BID) | ORAL | Status: DC
  Administered 2018-04-12 – 2018-04-15 (×7): 5 mg via ORAL
  Filled 2018-04-12 (×7): qty 1

## 2018-04-12 MED ORDER — POTASSIUM CHLORIDE CRYS ER 20 MEQ PO TBCR
40.0000 meq | EXTENDED_RELEASE_TABLET | Freq: Once | ORAL | Status: AC
  Administered 2018-04-12: 40 meq via ORAL
  Filled 2018-04-12: qty 2

## 2018-04-12 MED ORDER — LACTATED RINGERS IV SOLN
INTRAVENOUS | Status: DC | PRN
  Administered 2018-04-12: 10:00:00 via INTRAVENOUS

## 2018-04-12 MED ORDER — FENTANYL CITRATE (PF) 100 MCG/2ML IJ SOLN
INTRAMUSCULAR | Status: DC | PRN
  Administered 2018-04-12: 50 ug via INTRAVENOUS

## 2018-04-12 MED ORDER — MAGNESIUM SULFATE 2 GM/50ML IV SOLN
2.0000 g | Freq: Once | INTRAVENOUS | Status: AC
  Administered 2018-04-12: 2 g via INTRAVENOUS
  Filled 2018-04-12: qty 50

## 2018-04-12 MED ORDER — MIDAZOLAM HCL 2 MG/2ML IJ SOLN
INTRAMUSCULAR | Status: DC | PRN
  Administered 2018-04-12 (×2): 1 mg via INTRAVENOUS

## 2018-04-12 MED ORDER — PROPOFOL 500 MG/50ML IV EMUL
INTRAVENOUS | Status: DC | PRN
  Administered 2018-04-12: 200 ug/kg/min via INTRAVENOUS

## 2018-04-12 MED ORDER — LIDOCAINE 2% (20 MG/ML) 5 ML SYRINGE
INTRAMUSCULAR | Status: DC | PRN
  Administered 2018-04-12: 60 mg via INTRAVENOUS

## 2018-04-12 MED ORDER — PROPOFOL 10 MG/ML IV BOLUS
INTRAVENOUS | Status: DC | PRN
  Administered 2018-04-12: 30 mg via INTRAVENOUS

## 2018-04-12 SURGICAL SUPPLY — 14 items

## 2018-04-12 NOTE — Transfer of Care (Signed)
Immediate Anesthesia Transfer of Care Note  Patient: Laurie Lynch  Procedure(s) Performed: ESOPHAGOGASTRODUODENOSCOPY (EGD) WITH PROPOFOL (N/A )  Patient Location: Endoscopy Unit  Anesthesia Type:MAC  Level of Consciousness: drowsy  Airway & Oxygen Therapy: Patient Spontanous Breathing and Patient connected to nasal cannula oxygen  Post-op Assessment: Report given to RN and Post -op Vital signs reviewed and stable  Post vital signs: Reviewed and stable  Last Vitals:  Vitals Value Taken Time  BP    Temp    Pulse 94 04/12/2018 10:36 AM  Resp 15 04/12/2018 10:36 AM  SpO2 99 % 04/12/2018 10:36 AM  Vitals shown include unvalidated device data.  Last Pain:  Vitals:   04/12/18 0849  TempSrc: Oral  PainSc: 10-Worst pain ever      Patients Stated Pain Goal: 0 (86/76/19 5093)  Complications: No apparent anesthesia complications

## 2018-04-12 NOTE — Anesthesia Procedure Notes (Signed)
Procedure Name: MAC Date/Time: 04/12/2018 10:27 AM Performed by: Imagene Riches, CRNA Pre-anesthesia Checklist: Patient identified, Emergency Drugs available, Suction available, Patient being monitored and Timeout performed Patient Re-evaluated:Patient Re-evaluated prior to induction Oxygen Delivery Method: Nasal cannula Preoxygenation: Pre-oxygenation with 100% oxygen

## 2018-04-12 NOTE — Progress Notes (Signed)
Laurie Lynch 9:00 AM  Subjective: The patient without any signs of bleeding or GI complaints  Objective: Vital signs stable afebrile no acute distress exam please see preassessment evaluation labs stable  Assessment: Guaiac positive need for blood thinners negative colonoscopy a year and a half ago at [facility]: Okay to proceed with endoscopy with anesthesia assistance  [facility] E  Pager [phone] After 5PM or if no answer call [phone]

## 2018-04-12 NOTE — Progress Notes (Signed)
Occupational Therapy Treatment Patient Details Name: Laurie Lynch MRN: [id number] DOB: 08-07-1960 Today's Date: 04/12/2018    History of present illness Pt is a 57 y.o. female admitted 04/03/18 with abdominal pain and melena; found to have a-fib with RVR and acute on chronic R-side heart failure. Early signs of ETOH withdrawal placed on CIWA. Of note, recent admit to [facility] with AKI and alcohol-related probkems. PMH includes ETOH abuse, COPD, depression, chronic pain.   OT comments  Pt with HR 147-100 during session and c/o pain in R ankle. Pt limping and requires mod (A) hand held with OT. R ankle appears the same at L ankle at this time. Pt declined ice when offered.   Follow Up Recommendations  SNF;Supervision/Assistance - 24 hour    Equipment Recommendations  None recommended by OT    Recommendations for Other Services PT consult    Precautions / Restrictions Precautions Precautions: Fall;Other (comment) Precaution Comments: tachycardia- watch HR       Mobility Bed Mobility Overal bed mobility: Independent                Transfers Overall transfer level: Needs assistance   Transfers: Sit to/from Stand Sit to Stand: Min guard Stand pivot transfers: Mod assist       General transfer comment: requires hand held (A)     Balance                               High Level Balance Comments: limping on R LE. bil Ankles appear the same in size and color           ADL either performed or assessed with clinical judgement   ADL Overall ADL's : Needs assistance/impaired     Grooming: Wash/dry hands;Min guard;Standing Grooming Details (indicate cue type and reason): requires counter for support             Lower Body Dressing: Min guard;Sit to/from stand Lower Body Dressing Details (indicate cue type and reason): able to figure 4 and able to sit <.stand without (A) Toilet Transfer: Supervision/safety;Ambulation;Regular Toilet   Toileting-  Architect and Hygiene: Supervision/safety       Functional mobility during ADLs: Moderate assistance General ADL Comments: pt very easily frustrated during session and very short with commands to therapist. Called down to dining to make sure food was ordered and in fact has been ordered. pt states "oh all that is okay then" pt after (A)ed with pending food becomes more present and thanking therapist for their help.      Vision       Perception     Praxis      Cognition Arousal/Alertness: Awake/alert Behavior During Therapy: Flat affect Overall Cognitive Status: Impaired/Different from baseline Area of Impairment: Memory;Safety/judgement;Awareness                     Memory: Decreased short-term memory   Safety/Judgement: Decreased awareness of safety;Decreased awareness of deficits Awareness: Emergent Problem Solving: Slow processing;Difficulty sequencing General Comments: pt asking the same questions several times during session. pt exiting the bathroom without holding any surface and no facial indication of pain. OT introducing themselves and then patient required mod (A) for basic transfer to bed surface and leaning heavily on counter        Exercises     Shoulder Instructions       General Comments HR 147 exiting bathroom. pt sitting eob with HR 100-109  Pertinent Vitals/ Pain       Pain Assessment: Faces Faces Pain Scale: Hurts whole lot Pain Location: R ankle Pain Descriptors / Indicators: Discomfort;Dull Pain Intervention(s): Monitored during session;Repositioned  Home Living                                          Prior Functioning/Environment              Frequency  Min 2X/week        Progress Toward Goals  OT Goals(current goals can now be found in the care plan section)  Progress towards OT goals: Progressing toward goals  Acute Rehab OT Goals Patient Stated Goal: to get some food OT Goal  Formulation: With patient Time For Goal Achievement: 04/21/18 Potential to Achieve Goals: Good ADL Goals Pt Will Perform Grooming: with modified independence;standing Pt Will Perform Upper Body Dressing: with set-up;sitting Pt Will Perform Lower Body Dressing: with set-up;sit to/from stand Pt Will Transfer to Toilet: with modified independence;bedside commode;ambulating;stand pivot transfer Pt Will Perform Toileting - Clothing Manipulation and hygiene: with modified independence;sit to/from stand Pt Will Perform Tub/Shower Transfer: Tub transfer;with supervision;shower seat;ambulating;grab bars  Plan Discharge plan remains appropriate    Co-evaluation                 AM-PAC PT "6 Clicks" Daily Activity     Outcome Measure   Help from another person eating meals?: A Little Help from another person taking care of personal grooming?: A Little Help from another person toileting, which includes using toliet, bedpan, or urinal?: A Little Help from another person bathing (including washing, rinsing, drying)?: A Lot Help from another person to put on and taking off regular upper body clothing?: A Little Help from another person to put on and taking off regular lower body clothing?: A Little 6 Click Score: 17    End of Session    OT Visit Diagnosis: Other abnormalities of gait and mobility (R26.89);Muscle weakness (generalized) (M62.81);Pain Pain - Right/Left: Right Pain - part of body: Arm   Activity Tolerance Patient tolerated treatment well   Patient Left in bed;with call bell/phone within reach;with bed alarm set   Nurse Communication Mobility status;Precautions        Time: 1747-1595 OT Time Calculation (min): 14 min  Charges: OT General Charges $OT Visit: 1 Visit OT Treatments $Self Care/Home Management : 8-22 mins   Mateo Flow, OTR/L  Acute Rehabilitation Services Pager: [phone] Office: [phone] .    Boone Master B 04/12/2018, 4:19  PM

## 2018-04-12 NOTE — Plan of Care (Signed)
  Problem: Activity: Goal: Risk for activity intolerance will decrease Outcome: Progressing   Problem: Nutrition: Goal: Adequate nutrition will be maintained Outcome: Progressing   

## 2018-04-12 NOTE — Progress Notes (Signed)
Spoke with Endo about the current patients access. Patient does not have a midline, patient has a PIV that is 1.88". Endo stated they would try to access the patient for another IV site, but if unsuccessful would notify IV team.

## 2018-04-12 NOTE — Progress Notes (Signed)
OT Cancellation Note  Patient Details Name: Laurie Lynch MRN: [id number] DOB: 1961/01/11   Cancelled Treatment:    Reason Eval/Treat Not Completed: Patient at procedure or test/ unavailable(endoscopy - spoke with RN ) OT to check back as appropriate with pending home d/c plan per notes   Mateo Flow, OTR/L  Acute Rehabilitation Services Pager: [phone] Office: [phone] .   Boone Master B 04/12/2018, 8:59 AM

## 2018-04-12 NOTE — Progress Notes (Signed)
PROGRESS NOTE    Laurie Lynch  DTO:[id number] DOB: March 09, 1961 DOA: 04/03/2018 PCP: No primary care provider on file.    Brief Narrative:  57 year old female who presented with abdominal pain, edema and melena.  She does have significant past medical history for chronic atrial fibrillation, depression, anxiety, COPD and alcohol abuse.  Reported periumbilical abdominal pain and melena.  She had a recent hospitalization due to atrial fibrillation and acute kidney injury, her anticoagulants and diuretics were discontinued and diltiazem was reduced.  Due to persistent symptoms EMS was called she was found in atrial fibrillation with rapid ventricular response and received metoprolol.  On initial physical examination her heart rate was 110, pressure 151/79, respiratory rate 17, oxygen saturation 94%.  Moist mucous membranes, lungs clear to auscultation bilaterally, heart S1-S2 present irregularly irregular, 120 bpm, abdomen tender in the mid abdomen, no rebound no guarding, positive pretibial pitting edema bilaterally.  Patient was admitted to the hospital with working diagnosis of atrial fibrillation with rapid ventricular response.  Assessment & Plan:   Principal Problem:   Atrial fibrillation with RVR (HCC) Active Problems:   Occult GI bleeding   Alcohol dependence with uncomplicated intoxication (HCC)   Hypoalbuminemia   Hypoglycemia without diagnosis of diabetes mellitus   Frequent falls   1. Atrial fibrillation with rapid ventricular response. Heart rate continue to be uncontrolled, had spikes up to 144 bpm, continue to be symptomatic, will continue AV blockade with diltiazem 30 mg q 8 hours and metoprolol 100 mg bid (succinate). Anticoagulation has been resumed, due to low risk of GI bleeding. Plan for TEE and DC cardioversion per cardiology.   2. Alcohol abuse. No clinical signs of withdrawal, will continue neuro checks. Continue as needed lorazepam. Continue thiamine.   3. Depression.  Continue buspirone.    4. Right ankle pain. Will follow on radiographs.   5. Acute on chronic diastolic heart failure. Improved volume status, will continue to target negative fluid balance with oral furosemide.   DVT prophylaxis: apixaban  Code Status:  full Family Communication: no family a the bedside  Disposition Plan/ discharge barriers: pending dc cardioversion.    Consultants:   Cardiology   Procedures:     Antimicrobials:       Subjective: Patient not feeling well this am, continue to have abdominal pain, dull in nature, no further melena or hematochezia, no nausea or vomiting, has been npo after midnight.   Objective: Vitals:   04/11/18 2354 04/12/18 0430 04/12/18 0500 04/12/18 0830  BP: 111/77 (!) 159/99  (!) 150/97  Pulse: 91 91  89  Resp: 16 16    Temp: 98.9 F (37.2 C) 98.8 F (37.1 C)  97.8 F (36.6 C)  TempSrc: Oral Oral  Oral  SpO2: 100% 92% 92% 94%  Weight:  81.9 kg    Height:        Intake/Output Summary (Last 24 hours) at 04/12/2018 0839 Last data filed at 04/12/2018 0500 Gross per 24 hour  Intake 240 ml  Output 250 ml  Net -10 ml   Filed Weights   04/10/18 0300 04/11/18 0523 04/12/18 0430  Weight: 82.1 kg 81.5 kg 81.9 kg    Examination:   General: deconditioned  Neurology: Awake and alert, non focal  E ENT: mild pallor, no icterus, oral mucosa moist Cardiovascular: No JVD. S1-S2 present, irregularly irregular no gallops, rubs, or murmurs. No lower extremity edema. Pulmonary: decreased breath sounds bilaterally at bases, no wheezing, rhonchi or rales. Gastrointestinal. Abdomen mild distended, no organomegaly, non  tender, no rebound or guarding Skin. No rashes Musculoskeletal: no joint deformities     Data Reviewed: I have personally reviewed following labs and imaging studies  CBC: Recent Labs  Lab 04/06/18 0258 04/08/18 0706 04/12/18 0405  WBC 5.0 6.5 7.9  HGB 11.9* 12.9 13.1  HCT 37.9 39.3 39.7  MCV 100.5* 96.1 95.7    PLT 345 255 344   Basic Metabolic Panel: Recent Labs  Lab 04/08/18 0706 04/09/18 0507 04/10/18 0545 04/11/18 0717 04/12/18 0405  NA 142 141 140 141 140  K 3.4* 3.5 3.6 3.2* 3.5  CL 96* 97* 97* 98 102  CO2 33* 32 31 30 29   GLUCOSE 86 83 90 95 98  BUN 8 7 8 7 7   CREATININE 0.86 0.74 0.77 0.75 0.82  CALCIUM 8.4* 8.2* 8.8* 8.6* 8.9  MG 1.5* 1.6* 1.8 1.7 1.7   GFR: Estimated Creatinine Clearance: 85.6 mL/min (by C-G formula based on SCr of 0.82 mg/dL). Liver Function Tests: Recent Labs  Lab 04/06/18 0258 04/07/18 0712 04/08/18 0706 04/09/18 0507  AST 327* 228* 131* 86*  ALT 69* 70* 50* 38  ALKPHOS 136* 147* 134* 130*  BILITOT 1.4* 1.0 1.1 0.8  PROT 4.9* 5.6* 5.4* 5.4*  ALBUMIN 2.4* 2.5* 2.5* 2.4*   No results for input(s): LIPASE, AMYLASE in the last 168 hours. No results for input(s): AMMONIA in the last 168 hours. Coagulation Profile: No results for input(s): INR, PROTIME in the last 168 hours. Cardiac Enzymes: No results for input(s): CKTOTAL, CKMB, CKMBINDEX, TROPONINI in the last 168 hours. BNP (last 3 results) No results for input(s): PROBNP in the last 8760 hours. HbA1C: No results for input(s): HGBA1C in the last 72 hours. CBG: Recent Labs  Lab 04/11/18 1633 04/11/18 2027 04/11/18 2354 04/12/18 0513 04/12/18 0725  GLUCAP 83 108* 94 83 91   Lipid Profile: No results for input(s): CHOL, HDL, LDLCALC, TRIG, CHOLHDL, LDLDIRECT in the last 72 hours. Thyroid Function Tests: No results for input(s): TSH, T4TOTAL, FREET4, T3FREE, THYROIDAB in the last 72 hours. Anemia Panel: No results for input(s): VITAMINB12, FOLATE, FERRITIN, TIBC, IRON, RETICCTPCT in the last 72 hours.    Radiology Studies: I have reviewed all of the imaging during this hospital visit personally     Scheduled Meds: . busPIRone  30 mg Oral BID  . chlordiazePOXIDE  10 mg Oral BID  . diltiazem  30 mg Oral Q8H  . DULoxetine  60 mg Oral Daily  . feeding supplement (ENSURE  ENLIVE)  237 mL Oral BID BM  . folic acid  1 mg Oral Daily  . furosemide  40 mg Oral Daily  . gabapentin  600 mg Oral TID  . LORazepam  0.5 mg Oral Once  . magnesium oxide  400 mg Oral BID  . metoprolol succinate  100 mg Oral BID  . multivitamin with minerals  1 tablet Oral Daily  . pantoprazole  40 mg Oral BID AC  . roflumilast  500 mcg Oral Daily  . sodium chloride flush  3 mL Intravenous Q12H  . vitamin B-1  250 mg Oral Daily  . vitamin B-12  500 mcg Oral Daily   Continuous Infusions:   LOS: 8 days        Enis Riecke Annett Gula, MD [facility] Pager [phone]

## 2018-04-12 NOTE — Op Note (Signed)
[facility] Patient Name: Laurie Lynch Procedure Date : 04/12/2018 MRN: [id number] Attending MD: Vida Rigger , MD Date of Birth: [birth year] CSN: [id number] Age: 57 Admit Type: Inpatient Procedure:                Upper GI endoscopy Indications:              Iron deficiency anemia secondary to chronic blood                            loss, Melena Providers:                Vida Rigger, MD, Tomma Rakers, RN, Verita Schneiders,                            Technician Referring MD:              Medicines:                Fentanyl 50 micrograms IV, Midazolam 2 mg IV,                            Propofol total dose 200 mg IV,60 mg IV lidocaine Complications:            No immediate complications. Estimated Blood Loss:     Estimated blood loss: none. Procedure:                Pre-Anesthesia Assessment:                           - Prior to the procedure, a History and Physical                            was performed, and patient medications and                            allergies were reviewed. The patient's tolerance of                            previous anesthesia was also reviewed. The risks                            and benefits of the procedure and the sedation                            options and risks were discussed with the patient.                            All questions were answered, and informed consent                            was obtained. Prior Anticoagulants: The patient has                            taken Eliquis (apixaban), last dose was 1 day prior  to procedure. ASA Grade Assessment: II - A patient                            with mild systemic disease. After reviewing the                            risks and benefits, the patient was deemed in                            satisfactory condition to undergo the procedure.                           After obtaining informed consent, the endoscope was                            passed  under direct vision. Throughout the                            procedure, the patient's blood pressure, pulse, and                            oxygen saturations were monitored continuously. The                            [id number]-H190 ([id number]) Olympus Adult EGD was introduced                            through the mouth, and advanced to the third part                            of duodenum. The upper GI endoscopy was                            accomplished without difficulty. The patient                            tolerated the procedure well. Scope In: Scope Out: Findings:      The larynx was normal.      A small hiatal hernia was present.      The entire examined stomach was normal.      The first portion of the duodenum, second portion of the duodenum and       third portion of the duodenum were normal.      The exam was otherwise without abnormality. Impression:               - Normal larynx.                           - Small hiatal hernia.                           - Normal stomach.                           - Normal first portion of the duodenum, second  portion of the duodenum and third portion of the                            duodenum.                           - The examination was otherwise normal.                           - No specimens collected. Recommendation:           - Patient has a contact number available for                            emergencies. The signs and symptoms of potential                            delayed complications were discussed with the                            patient. Return to normal activities tomorrow.                            Written discharge instructions were provided to the                            patient.                           - Soft diet today. please call if I be of any                            further assistance with this hospital stay                           - Continue present medications.                            - Return to GI clinic PRN.                           - Telephone GI clinic if symptomatic PRN. Procedure Code(s):        --- Professional ---                           [phone], Esophagogastroduodenoscopy, flexible,                            transoral; diagnostic, including collection of                            specimen(s) by brushing or washing, when performed                            (separate procedure) Diagnosis Code(s):        --- Professional ---  K44.9, Diaphragmatic hernia without obstruction or                            gangrene                           D50.0, Iron deficiency anemia secondary to blood                            loss (chronic)                           K92.1, Melena (includes Hematochezia) CPT copyright 2017 [employer]. All rights reserved. The codes documented in this report are preliminary and upon coder review may  be revised to meet current compliance requirements. Vida Rigger, MD 04/12/2018 10:38:00 AM This report has been signed electronically. Number of Addenda: 0

## 2018-04-12 NOTE — Progress Notes (Signed)
Progress Note  Patient Name: Laurie Lynch Date of Encounter: 04/12/2018  Primary Cardiologist: Rollene Rotunda, MD   Subjective   Feels ok. More alert today, wake and watching TV. Asymptomatic at rest but she gets SOB with little ambulation.   Inpatient Medications    Scheduled Meds: . busPIRone  30 mg Oral BID  . chlordiazePOXIDE  10 mg Oral BID  . diltiazem  30 mg Oral Q8H  . DULoxetine  60 mg Oral Daily  . feeding supplement (ENSURE ENLIVE)  237 mL Oral BID BM  . folic acid  1 mg Oral Daily  . furosemide  40 mg Oral Daily  . gabapentin  600 mg Oral TID  . LORazepam  0.5 mg Oral Once  . magnesium oxide  400 mg Oral BID  . metoprolol succinate  100 mg Oral BID  . multivitamin with minerals  1 tablet Oral Daily  . pantoprazole  40 mg Oral BID AC  . roflumilast  500 mcg Oral Daily  . sodium chloride flush  3 mL Intravenous Q12H  . vitamin B-1  250 mg Oral Daily  . vitamin B-12  500 mcg Oral Daily   Continuous Infusions:  PRN Meds: acetaminophen **OR** acetaminophen, albuterol, diltiazem, HYDROcodone-acetaminophen, LORazepam, metoprolol tartrate, naphazoline-glycerin, [DISCONTINUED] ondansetron **OR** ondansetron (ZOFRAN) IV, senna-docusate   Vital Signs    Vitals:   04/11/18 1944 04/11/18 2354 04/12/18 0430 04/12/18 0500  BP: 128/88 111/77 (!) 159/99   Pulse: 99 91 91   Resp: (!) 21 16 16    Temp: 98.7 F (37.1 C) 98.9 F (37.2 C) 98.8 F (37.1 C)   TempSrc:  Oral Oral   SpO2: 100% 100% 92% 92%  Weight:   81.9 kg   Height:        Intake/Output Summary (Last 24 hours) at 04/12/2018 0646 Last data filed at 04/12/2018 0500 Gross per 24 hour  Intake 240 ml  Output 250 ml  Net -10 ml   Filed Weights   04/10/18 0300 04/11/18 0523 04/12/18 0430  Weight: 82.1 kg 81.5 kg 81.9 kg    Telemetry    Atrial fib in the 110s-120s resting- Personally Reviewed  ECG    Atrial fibrillation  - Personally Reviewed  Physical Exam   GEN: WF who looks older than  actual age No acute distress.   Neck: No JVD Cardiac: irregularly irregular rhythm, tachy rate, no murmurs, rubs, or gallops.  Respiratory: Clear to auscultation bilaterally. GI: Soft, nontender, non-distended  MS: 1+ bilateral LEE edema; No deformity. Neuro:  Nonfocal  Psych: Normal affect   Labs    Chemistry Recent Labs  Lab 04/07/18 5364 04/08/18 0706 04/09/18 0507 04/10/18 0545 04/11/18 0717 04/12/18 0405  NA 142 142 141 140 141 140  K 3.9 3.4* 3.5 3.6 3.2* 3.5  CL 98 96* 97* 97* 98 102  CO2 32 33* 32 31 30 29   GLUCOSE 72 86 83 90 95 98  BUN 9 8 7 8 7 7   CREATININE 0.77 0.86 0.74 0.77 0.75 0.82  CALCIUM 8.4* 8.4* 8.2* 8.8* 8.6* 8.9  PROT 5.6* 5.4* 5.4*  --   --   --   ALBUMIN 2.5* 2.5* 2.4*  --   --   --   AST 228* 131* 86*  --   --   --   ALT 70* 50* 38  --   --   --   ALKPHOS 147* 134* 130*  --   --   --   BILITOT 1.0 1.1  0.8  --   --   --   GFRNONAA >60 >60 >60 >60 >60 >60  GFRAA >60 >60 >60 >60 >60 >60  ANIONGAP 12 13 12 12 13 9      Hematology Recent Labs  Lab 04/06/18 0258 04/08/18 0706 04/12/18 0405  WBC 5.0 6.5 7.9  RBC 3.77* 4.09 4.15  HGB 11.9* 12.9 13.1  HCT 37.9 39.3 39.7  MCV 100.5* 96.1 95.7  MCH 31.6 31.5 31.6  MCHC 31.4 32.8 33.0  RDW 16.5* 16.2* 16.1*  PLT 345 255 344    Cardiac EnzymesNo results for input(s): TROPONINI in the last 168 hours. No results for input(s): TROPIPOC in the last 168 hours.   BNPNo results for input(s): BNP, PROBNP in the last 168 hours.   DDimer No results for input(s): DDIMER in the last 168 hours.   Radiology    No results found.  Cardiac Studies   2D Echo3/10/2017 There is normal left ventricular wall thickness. The left ventricular ejection fraction is mildly reduced (45-50%). Septal motion is consistent with conduction abnormality. The aortic valve is trileaflet. Unable to adequately determine diastolic dysfunction. The mitral valve leaflets appear thickened, but open well. There is  mild-moderate (1-2+) mitral regurgitation. There is moderate (2+) tricuspid regurgitation. Right ventricular systolic pressure is elevated between 50-42mm Hg, consistent with moderately severe pulmonary hypertension. The left atrium is moderately dilated. The right atrium is mildly dilated.  Patient Profile     57 y.o.femalewith a PMH ofchronic atrial fibrillation on Eliquis,noncompliance, alcoholism on CIWA protocol, ?HFrEF (09/2017 EF 45-50%), frequent falls, COPD (prescribed home oxygen), depression and anxiety, who is being followed by cardiology for afib RVR.  Assessment & Plan    1. Atrial Fibrillation: Has been difficult to control. Resting rates currently in the 110s-120s and HR jumps into the 140s-150s with ambulation, making pt very symptomatic w/ exertional dyspnea. Metoprolol has been increased to 100 mg BID. She remains on Cardizem 30 mg Q8Hs. Pt is on Eliquis for a/c but given her ETOH history, she may not be a good candidate for long term anticoagulation, given bleed and fall risk. In addition, there were concerns regarding dark stools and + FOBT. Plan is for GI w/u w/ EGD today to help determine if ok to continue a/c at least short term. If so, she may benefit from TEE/guided DDCV and continuation of a/c for at least 1 month post cardioversion.   2. Acute on Chronic Combined Systolic and Diastolic HF: Echo 10/03/17 showed mildly reduced LVEF at 45-50%. Good diuresis thus far. Net I/Os negative 9.3L total. She does have slight LEE on exam and will need to continue PO lasix, 40 mg daily. Monitor I/Os closely.   3. VWU:[name] this morning prior to meds at 159/99. She is due to receive AM doses of metoprolol, Cardizem and PO Lasix.   4. ETOH Abuse: management per IM. On CIWA protocol.  5. Hypokalemia: Resolved w/ supplementation. K 3.5 today.   6. FOBT +: plan is for EGD today to assess for source of bleed. Hgb stable at 13.   For questions or updates, please contact  [facility] Please consult [email or web address] for contact info under        Signed, Robbie Lis, PA-C  04/12/2018, 6:46 AM

## 2018-04-13 DIAGNOSIS — E8809 Other disorders of plasma-protein metabolism, not elsewhere classified: Secondary | ICD-10-CM

## 2018-04-13 LAB — GLUCOSE, CAPILLARY
GLUCOSE-CAPILLARY: 100 mg/dL — AB (ref 70–99)
GLUCOSE-CAPILLARY: 91 mg/dL (ref 70–99)
GLUCOSE-CAPILLARY: 93 mg/dL (ref 70–99)
GLUCOSE-CAPILLARY: 98 mg/dL (ref 70–99)
Glucose-Capillary: 91 mg/dL (ref 70–99)
Glucose-Capillary: 102 mg/dL — ABNORMAL HIGH (ref 70–99)

## 2018-04-13 LAB — BASIC METABOLIC PANEL
Anion gap: 13 (ref 5–15)
BUN: 11 mg/dL (ref 6–20)
CALCIUM: 8.6 mg/dL — AB (ref 8.9–10.3)
CO2: 21 mmol/L — ABNORMAL LOW (ref 22–32)
CREATININE: 0.75 mg/dL (ref 0.44–1.00)
Chloride: 105 mmol/L (ref 98–111)
GFR calc Af Amer: >60 mL/min (ref >60)
GFR calc non Af Amer: >60 mL/min (ref >60)
GLUCOSE: 80 mg/dL (ref 70–99)
Potassium: 5.2 mmol/L — ABNORMAL HIGH (ref 3.5–5.1)
Sodium: 139 mmol/L (ref 135–145)

## 2018-04-13 LAB — MAGNESIUM: Magnesium: 1.9 mg/dL (ref 1.7–2.4)

## 2018-04-13 MED ORDER — SODIUM CHLORIDE 0.9 % IV SOLN: INTRAVENOUS | Status: DC

## 2018-04-13 MED ORDER — IBUPROFEN 200 MG PO TABS
400.0000 mg | ORAL_TABLET | Freq: Three times a day (TID) | ORAL | Status: DC
  Administered 2018-04-13 – 2018-04-15 (×6): 400 mg via ORAL
  Filled 2018-04-13 (×6): qty 2

## 2018-04-13 MED ORDER — SODIUM CHLORIDE 0.9% FLUSH: 3.0000 mL | INTRAVENOUS | Status: DC | PRN

## 2018-04-13 MED ORDER — SODIUM CHLORIDE 0.9% FLUSH
3.0000 mL | Freq: Two times a day (BID) | INTRAVENOUS | Status: DC
  Administered 2018-04-14 – 2018-04-15 (×2): 3 mL via INTRAVENOUS

## 2018-04-13 MED ORDER — DICLOFENAC SODIUM 1 % TD GEL
2.0000 g | Freq: Four times a day (QID) | TRANSDERMAL | Status: DC
  Administered 2018-04-13 – 2018-04-15 (×5): 2 g via TOPICAL
  Filled 2018-04-13: qty 100

## 2018-04-13 MED ORDER — SODIUM CHLORIDE 0.9 % IV SOLN
250.0000 mL | INTRAVENOUS | Status: DC
  Administered 2018-04-13: 250 mL via INTRAVENOUS

## 2018-04-13 NOTE — Progress Notes (Signed)
Laurie Lynch 12:14 PM  Subjective: Patient without any problems from her endoscopy and only complains about her ankle  Objective: Vital signs stable afebrile patient lying comfortably in bed not examined today no CBC done  Assessment: Resolved melena in patient on blood thinners  Plan: Please call me if I can be of any further assistance with this hospital stay otherwise see yesterday's note for any other recommendations  [facility] AtlantaMAGOD,Laurie Lynch  Pager [phone] After 5PM or if no answer call [phone]

## 2018-04-13 NOTE — Progress Notes (Addendum)
PROGRESS NOTE    Laurie Lynch  ZDG:[id number] DOB: October 18, 1960 DOA: 04/03/2018 PCP: No primary care provider on file.    Brief Narrative:  57 year old female who presented with abdominal pain, edema and melena.  She does have significant past medical history for chronic atrial fibrillation, depression, anxiety, COPD and alcohol abuse.  Reported periumbilical abdominal pain and melena.  She had a recent hospitalization due to atrial fibrillation and acute kidney injury, her anticoagulants and diuretics were discontinued and diltiazem was reduced.  Due to persistent symptoms EMS was called she was found in atrial fibrillation with rapid ventricular response and received metoprolol.  On initial physical examination her heart rate was 110, pressure 151/79, respiratory rate 17, oxygen saturation 94%.  Moist mucous membranes, lungs clear to auscultation bilaterally, heart S1-S2 present irregularly irregular, 120 bpm, abdomen tender in the mid abdomen, no rebound no guarding, positive pretibial pitting edema bilaterally.  Sodium 138, potassium 3.6, chloride 97, bicarb 23, glucose 52, BUN 5, creatinine 0.72, BNP 466, troponin I 0.03, white cell count 5.5, hemoglobin 12.3, hematocrit 37.8, platelets 352.  Urinalysis had specific gravity 1.033.  Alcohol level was 38, urine drug screen positive for opiates.  CT of the abdomen with no acute changes, severe hepatic steatosis.  Chest radiograph with left lower lobe atelectasis.  EKG with atrial fibrillation, normal axis, poor R wave progression.  Patient was admitted to the hospital with working diagnosis of atrial fibrillation with rapid ventricular response.   Assessment & Plan:   Principal Problem:   Atrial fibrillation with RVR (HCC) Active Problems:   Occult GI bleeding   Alcohol dependence with uncomplicated intoxication (HCC)   Hypoalbuminemia   Hypoglycemia without diagnosis of diabetes mellitus   Frequent falls   1. Atrial fibrillation with rapid  ventricular response. Continue atrial fibrillation rhythm on telemetry, personally reviewed. Heart rate has been less than 100 bpm at rest. Continue AV blockade with diltiazem 30 mg q 8 hours and metoprolol 100 mg bid (succinate). Apixaban for anticoagulation. Not able to ambulate due to right ankle pain.   2. Alcohol abuse. Neuro checks per unit protocol. On PRN lorazepam. Continue thiamine.   3. Depression. On buspirone.    4. Right ankle pain. Persistent and worsening pain on the right ankle, patient not able to ambulate, radiographs with no fracture. Will add ibuprofen for pain control and topical diclofenac.Will consult OT for possible right orthopedic boot to help right ankle pain.   5. Acute on chronic diastolic heart failure. Continue furosemide for diuresis, preserved LV systolic function, will continue metoprolol.   DVT prophylaxis: apixaban  Code Status:  full Family Communication: no family a the bedside  Disposition Plan/ discharge barriers: patient will need SNF   Consultants:   Cardiology   Procedures:     Antimicrobials:   Subjective: Patient continue to have right ankle pain, which has been constant and severe, worse with ambulation, sharp in nature, no radiation, improved with immobility.   Objective: Vitals:   04/13/18 0023 04/13/18 0550 04/13/18 0839 04/13/18 1118  BP: (!) 122/47 (!) 141/100 120/79 112/69  Pulse: 87 (!) 118 91 82  Resp: 15     Temp: 98.6 F (37 C) (!) 97.3 F (36.3 C)  98.2 F (36.8 C)  TempSrc: Oral Oral  Oral  SpO2: 100% 99%  94%  Weight:  82.1 kg    Height:        Intake/Output Summary (Last 24 hours) at 04/13/2018 1225 Last data filed at 04/13/2018 1156 Gross per  24 hour  Intake 1560 ml  Output 1201 ml  Net 359 ml   Filed Weights   04/11/18 0523 04/12/18 0430 04/13/18 0550  Weight: 81.5 kg 81.9 kg 82.1 kg    Examination:   General: deconditioned and in pain.  Neurology: Awake and alert, non focal  E ENT: no  pallor, no icterus, oral mucosa moist Cardiovascular: No JVD. S1-S2 present, irregularly irregular, no gallops, rubs, or murmurs. Trace lower extremity edema. Pulmonary: positive breath sounds bilaterally, adequate air movement, no wheezing, rhonchi or rales. Gastrointestinal. Abdomen with no organomegaly, non tender, no rebound or guarding Skin. No rashes Musculoskeletal: right ankle non pitting edema, with decrease range of motion due to pain, tender to palpation, no increased local temperature or erythema.      Data Reviewed: I have personally reviewed following labs and imaging studies  CBC: Recent Labs  Lab 04/08/18 0706 04/12/18 0405  WBC 6.5 7.9  HGB 12.9 13.1  HCT 39.3 39.7  MCV 96.1 95.7  PLT 255 344   Basic Metabolic Panel: Recent Labs  Lab 04/09/18 0507 04/10/18 0545 04/11/18 0717 04/12/18 0405 04/13/18 0506  NA 141 140 141 140 139  K 3.5 3.6 3.2* 3.5 5.2*  CL 97* 97* 98 102 105  CO2 32 31 30 29  21*  GLUCOSE 83 90 95 98 80  BUN 7 8 7 7 11   CREATININE 0.74 0.77 0.75 0.82 0.75  CALCIUM 8.2* 8.8* 8.6* 8.9 8.6*  MG 1.6* 1.8 1.7 1.7 1.9   GFR: Estimated Creatinine Clearance: 87.9 mL/min (by C-G formula based on SCr of 0.75 mg/dL). Liver Function Tests: Recent Labs  Lab 04/07/18 0712 04/08/18 0706 04/09/18 0507  AST 228* 131* 86*  ALT 70* 50* 38  ALKPHOS 147* 134* 130*  BILITOT 1.0 1.1 0.8  PROT 5.6* 5.4* 5.4*  ALBUMIN 2.5* 2.5* 2.4*   No results for input(s): LIPASE, AMYLASE in the last 168 hours. No results for input(s): AMMONIA in the last 168 hours. Coagulation Profile: No results for input(s): INR, PROTIME in the last 168 hours. Cardiac Enzymes: No results for input(s): CKTOTAL, CKMB, CKMBINDEX, TROPONINI in the last 168 hours. BNP (last 3 results) No results for input(s): PROBNP in the last 8760 hours. HbA1C: No results for input(s): HGBA1C in the last 72 hours. CBG: Recent Labs  Lab 04/12/18 1946 04/13/18 0020 04/13/18 0547  04/13/18 0748 04/13/18 1115  GLUCAP 73 100* 91 91 98   Lipid Profile: No results for input(s): CHOL, HDL, LDLCALC, TRIG, CHOLHDL, LDLDIRECT in the last 72 hours. Thyroid Function Tests: No results for input(s): TSH, T4TOTAL, FREET4, T3FREE, THYROIDAB in the last 72 hours. Anemia Panel: No results for input(s): VITAMINB12, FOLATE, FERRITIN, TIBC, IRON, RETICCTPCT in the last 72 hours.    Radiology Studies: I have reviewed all of the imaging during this hospital visit personally     Scheduled Meds: . apixaban  5 mg Oral BID  . busPIRone  30 mg Oral BID  . chlordiazePOXIDE  10 mg Oral BID  . diltiazem  30 mg Oral Q8H  . DULoxetine  60 mg Oral Daily  . feeding supplement (ENSURE ENLIVE)  237 mL Oral BID BM  . folic acid  1 mg Oral Daily  . furosemide  40 mg Oral Daily  . gabapentin  600 mg Oral TID  . LORazepam  0.5 mg Oral Once  . metoprolol succinate  100 mg Oral BID  . multivitamin with minerals  1 tablet Oral Daily  . pantoprazole  40  mg Oral BID AC  . roflumilast  500 mcg Oral Daily  . sodium chloride flush  3 mL Intravenous Q12H  . vitamin B-1  250 mg Oral Daily  . vitamin B-12  500 mcg Oral Daily   Continuous Infusions: . sodium chloride    . sodium chloride       LOS: 9 days        Coralie Keens, MD [facility] Pager [phone]

## 2018-04-13 NOTE — H&P (View-Only) (Signed)
 Progress Note  Patient Name: Laurie Lynch Date of Encounter: 04/13/2018  Primary Cardiologist: Laurie Hochrein, MD   Subjective   No chest pain or SOB, concerned about her sprained ankle.  Gave time for TEE DCCV tomorrow  Inpatient Medications    Scheduled Meds: . apixaban  5 mg Oral BID  . busPIRone  30 mg Oral BID  . chlordiazePOXIDE  10 mg Oral BID  . diclofenac sodium  2 g Topical QID  . diltiazem  30 mg Oral Q8H  . DULoxetine  60 mg Oral Daily  . feeding supplement (ENSURE ENLIVE)  237 mL Oral BID BM  . folic acid  1 mg Oral Daily  . furosemide  40 mg Oral Daily  . gabapentin  600 mg Oral TID  . ibuprofen  400 mg Oral TID  . LORazepam  0.5 mg Oral Once  . metoprolol succinate  100 mg Oral BID  . multivitamin with minerals  1 tablet Oral Daily  . pantoprazole  40 mg Oral BID AC  . roflumilast  500 mcg Oral Daily  . sodium chloride flush  3 mL Intravenous Q12H  . vitamin B-1  250 mg Oral Daily  . vitamin B-12  500 mcg Oral Daily   Continuous Infusions: . sodium chloride    . sodium chloride     PRN Meds: acetaminophen **OR** acetaminophen, albuterol, diltiazem, HYDROcodone-acetaminophen, LORazepam, metoprolol tartrate, naphazoline-glycerin, [DISCONTINUED] ondansetron **OR** ondansetron (ZOFRAN) IV, senna-docusate   Vital Signs    Vitals:   04/13/18 0023 04/13/18 0550 04/13/18 0839 04/13/18 1118  BP: (!) 122/47 (!) 141/100 120/79 112/69  Pulse: 87 (!) 118 91 82  Resp: 15     Temp: 98.6 F (37 C) (!) 97.3 F (36.3 C)  98.2 F (36.8 C)  TempSrc: Oral Oral  Oral  SpO2: 100% 99%  94%  Weight:  82.1 kg    Height:        Intake/Output Summary (Last 24 hours) at 04/13/2018 1310 Last data filed at 04/13/2018 1156 Gross per 24 hour  Intake 1560 ml  Output 1201 ml  Net 359 ml   Filed Weights   04/11/18 0523 04/12/18 0430 04/13/18 0550  Weight: 81.5 kg 81.9 kg 82.1 kg    Telemetry    A fib rate controlled - Personally Reviewed  ECG    No new -  Personally Reviewed  Physical Exam   GEN: No acute distress.   Neck: No JVD Cardiac:irreg irreg, no murmurs, rubs, or gallops.  Respiratory: Clear to auscultation bilaterally. GI: Soft, nontender, non-distended  MS: No edema; No deformity. Neuro:  Nonfocal  Psych: Normal affect   Labs    Chemistry Recent Labs  Lab 04/07/18 0712 04/08/18 0706 04/09/18 0507  04/11/18 0717 04/12/18 0405 04/13/18 0506  NA 142 142 141   < > 141 140 139  K 3.9 3.4* 3.5   < > 3.2* 3.5 5.2*  CL 98 96* 97*   < > 98 102 105  CO2 32 33* 32   < > 30 29 21*  GLUCOSE 72 86 83   < > 95 98 80  BUN 9 8 7   < > 7 7 11  CREATININE 0.77 0.86 0.74   < > 0.75 0.82 0.75  CALCIUM 8.4* 8.4* 8.2*   < > 8.6* 8.9 8.6*  PROT 5.6* 5.4* 5.4*  --   --   --   --   ALBUMIN 2.5* 2.5* 2.4*  --   --   --   --     AST 228* 131* 86*  --   --   --   --   ALT 70* 50* 38  --   --   --   --   ALKPHOS 147* 134* 130*  --   --   --   --   BILITOT 1.0 1.1 0.8  --   --   --   --   GFRNONAA >60 >60 >60   < > >60 >60 >60  GFRAA >60 >60 >60   < > >60 >60 >60  ANIONGAP 12 13 12   < > 13 9 13   < > = values in this interval not displayed.     Hematology Recent Labs  Lab 04/08/18 0706 04/12/18 0405  WBC 6.5 7.9  RBC 4.09 4.15  HGB 12.9 13.1  HCT 39.3 39.7  MCV 96.1 95.7  MCH 31.5 31.6  MCHC 32.8 33.0  RDW 16.2* 16.1*  PLT 255 344    Cardiac EnzymesNo results for input(s): TROPONINI in the last 168 hours. No results for input(s): TROPIPOC in the last 168 hours.   BNPNo results for input(s): BNP, PROBNP in the last 168 hours.   DDimer No results for input(s): DDIMER in the last 168 hours.   Radiology    Dg Ankle 2 Views Right  Result Date: 04/12/2018 CLINICAL DATA:  Right ankle pain history of ORIF hardware removal EXAM: RIGHT ANKLE - 2 VIEW COMPARISON:  None. FINDINGS: No acute displaced fracture or malalignment. Ghost tracks within the distal fibula consistent with prior hardware. Old fracture deformity. Ankle mortise  is grossly symmetric. Minimal periosteal reaction of the distal tibia. Diffuse soft tissue swelling. Sclerosis and lucency in the posterior calcaneus, possible bone infarct. Vascular calcifications. IMPRESSION: 1. No acute osseous abnormality. 2. Evidence of prior postsurgical and posttraumatic deformity of the fibula. 3. Minimal periosteal reaction of the distal tibia without definitive fracture deformity 4. 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 Electronically Signed   By: Laurie  Lynch M.D.   On: 04/12/2018 15:42    Cardiac Studies   2D Echo3/10/2017 There is normal left ventricular wall thickness. The left ventricular ejection fraction is mildly reduced (45-50%). Septal motion is consistent with conduction abnormality. The aortic valve is trileaflet. Unable to adequately determine diastolic dysfunction. The mitral valve leaflets appear thickened, but open well. There is mild-moderate (1-2+) mitral regurgitation. There is moderate (2+) tricuspid regurgitation. Right ventricular systolic pressure is elevated between 50-60mm Hg, consistent with moderately severe pulmonary hypertension. The left atrium is moderately dilated. The right atrium is mildly dilated.  Patient Profile     57 y.o. female with a PMH ofchronic atrial fibrillation on Eliquis,noncompliance, alcoholism on CIWA protocol, ?HFrEF (09/2017 EF 45-50%), frequent falls, COPD (prescribed home oxygen), depression and anxiety,now with afib RVR.   Assessment & Plan    . Atrial Fibrillation: Has been difficult to control. Resting rates currently in the 110s-120s and HR jumps into the 140s-150s with ambulation, making pt very symptomatic w/ exertional dyspnea. Metoprolol has been increased to 100 mg BID. She remains on Cardizem 30 mg Q8Hs. Pt is on Eliquis for a/c butgiven her ETOH history, she may not be a good candidate for long term anticoagulation, given bleed and fall risk.In addition, there were concerns  regarding dark stools and + FOBT. GI w/u w/ EGD was normal except for small hiatal hernia  -- TEE/guided DDCV for tomorrow with Dr. McLean at 1215  and continuation of a/c for at least 1 month post cardioversion.      [facility] has been requested to perform a transesophageal echocardiogram on Laurie Lynch for atrial fib with DCCV.  After careful review of history and examination, the risks and benefits of transesophageal echocardiogram have been explained including risks of esophageal damage, perforation (1:10,000 risk), bleeding, pharyngeal hematoma as well as other potential complications associated with conscious sedation including aspiration, arrhythmia, respiratory failure and death. Alternatives to treatment were discussed, questions were answered. Patient is willing to proceed.   2. Acute on Chronic Combined Systolic and Diastolic HF: Echo 10/03/17 showed mildly reduced LVEF at 45-50%. Good diuresis thus far. Net I/Os negative 9.8L total. She does have tr LEE on exam and will need to continue PO lasix, 40 mg daily. Monitor I/Os closely.  Wt was 93 kg at peak and now 82.1 Kg  3. HTN:improved today but at 0500 she was 141/100 now 112/68    4. ETOH Abuse: management per IM. On CIWA protocol.  5. Hypokalemia: Resolved w/ supplementation. K+ now up to 5.2 today   6. FOBT +: EGD was normal except for hiatal hernia      For questions or updates, please contact [facility] Please consult [email or web address] for contact info under        Signed, Laura Ingold, NP  04/13/2018, 1:10 PM    Pt. Seen and examined. Agree with the Resident/NP/PA-C note as written. Plan for TEE/DCCV tomorrow. Will have 5th consecutive dose of Eliquis tomorrow morning.  Time Spent with Patient: I have spent a total of 15 minutes with patient reviewing hospital notes, telemetry, EKGs, labs and examining the patient as well as establishing an assessment and plan that was discussed with the patient. > 50% of  time was spent in direct patient care.  Yanilen Adamik C. Akacia Boltz, MD, FACC, FACP  [place]  [facility]  Medical Director of the Advanced Lipid Disorders &  Cardiovascular Risk Reduction Clinic Diplomate of the American Board of Clinical Lipidology Attending Cardiologist  Direct Dial: [phone]  Fax: [phone]  Website:  [email or web address]        

## 2018-04-13 NOTE — Progress Notes (Addendum)
Progress Note  Patient Name: Laurie Lynch Date of Encounter: 04/13/2018  Primary Cardiologist: Rollene Rotunda, MD   Subjective   No chest pain or SOB, concerned about her sprained ankle.  Gave time for TEE DCCV tomorrow  Inpatient Medications    Scheduled Meds: . apixaban  5 mg Oral BID  . busPIRone  30 mg Oral BID  . chlordiazePOXIDE  10 mg Oral BID  . diclofenac sodium  2 g Topical QID  . diltiazem  30 mg Oral Q8H  . DULoxetine  60 mg Oral Daily  . feeding supplement (ENSURE ENLIVE)  237 mL Oral BID BM  . folic acid  1 mg Oral Daily  . furosemide  40 mg Oral Daily  . gabapentin  600 mg Oral TID  . ibuprofen  400 mg Oral TID  . LORazepam  0.5 mg Oral Once  . metoprolol succinate  100 mg Oral BID  . multivitamin with minerals  1 tablet Oral Daily  . pantoprazole  40 mg Oral BID AC  . roflumilast  500 mcg Oral Daily  . sodium chloride flush  3 mL Intravenous Q12H  . vitamin B-1  250 mg Oral Daily  . vitamin B-12  500 mcg Oral Daily   Continuous Infusions: . sodium chloride    . sodium chloride     PRN Meds: acetaminophen **OR** acetaminophen, albuterol, diltiazem, HYDROcodone-acetaminophen, LORazepam, metoprolol tartrate, naphazoline-glycerin, [DISCONTINUED] ondansetron **OR** ondansetron (ZOFRAN) IV, senna-docusate   Vital Signs    Vitals:   04/13/18 0023 04/13/18 0550 04/13/18 0839 04/13/18 1118  BP: (!) 122/47 (!) 141/100 120/79 112/69  Pulse: 87 (!) 118 91 82  Resp: 15     Temp: 98.6 F (37 C) (!) 97.3 F (36.3 C)  98.2 F (36.8 C)  TempSrc: Oral Oral  Oral  SpO2: 100% 99%  94%  Weight:  82.1 kg    Height:        Intake/Output Summary (Last 24 hours) at 04/13/2018 1310 Last data filed at 04/13/2018 1156 Gross per 24 hour  Intake 1560 ml  Output 1201 ml  Net 359 ml   Filed Weights   04/11/18 0523 04/12/18 0430 04/13/18 0550  Weight: 81.5 kg 81.9 kg 82.1 kg    Telemetry    A fib rate controlled - Personally Reviewed  ECG    No new -  Personally Reviewed  Physical Exam   GEN: No acute distress.   Neck: No JVD Cardiac:irreg irreg, no murmurs, rubs, or gallops.  Respiratory: Clear to auscultation bilaterally. GI: Soft, nontender, non-distended  MS: No edema; No deformity. Neuro:  Nonfocal  Psych: Normal affect   Labs    Chemistry Recent Labs  Lab 04/07/18 7829 04/08/18 0706 04/09/18 0507  04/11/18 0717 04/12/18 0405 04/13/18 0506  NA 142 142 141   < > 141 140 139  K 3.9 3.4* 3.5   < > 3.2* 3.5 5.2*  CL 98 96* 97*   < > 98 102 105  CO2 32 33* 32   < > 30 29 21*  GLUCOSE 72 86 83   < > 95 98 80  BUN 9 8 7    < > 7 7 11   CREATININE 0.77 0.86 0.74   < > 0.75 0.82 0.75  CALCIUM 8.4* 8.4* 8.2*   < > 8.6* 8.9 8.6*  PROT 5.6* 5.4* 5.4*  --   --   --   --   ALBUMIN 2.5* 2.5* 2.4*  --   --   --   --  AST 228* 131* 86*  --   --   --   --   ALT 70* 50* 38  --   --   --   --   ALKPHOS 147* 134* 130*  --   --   --   --   BILITOT 1.0 1.1 0.8  --   --   --   --   GFRNONAA >60 >60 >60   < > >60 >60 >60  GFRAA >60 >60 >60   < > >60 >60 >60  ANIONGAP 12 13 12    < > 13 9 13    < > = values in this interval not displayed.     Hematology Recent Labs  Lab 04/08/18 0706 04/12/18 0405  WBC 6.5 7.9  RBC 4.09 4.15  HGB 12.9 13.1  HCT 39.3 39.7  MCV 96.1 95.7  MCH 31.5 31.6  MCHC 32.8 33.0  RDW 16.2* 16.1*  PLT 255 344    Cardiac EnzymesNo results for input(s): TROPONINI in the last 168 hours. No results for input(s): TROPIPOC in the last 168 hours.   BNPNo results for input(s): BNP, PROBNP in the last 168 hours.   DDimer No results for input(s): DDIMER in the last 168 hours.   Radiology    Dg Ankle 2 Views Right  Result Date: 04/12/2018 CLINICAL DATA:  Right ankle pain history of ORIF hardware removal EXAM: RIGHT ANKLE - 2 VIEW COMPARISON:  None. FINDINGS: No acute displaced fracture or malalignment. Ghost tracks within the distal fibula consistent with prior hardware. Old fracture deformity. Ankle mortise  is grossly symmetric. Minimal periosteal reaction of the distal tibia. Diffuse soft tissue swelling. Sclerosis and lucency in the posterior calcaneus, possible bone infarct. Vascular calcifications. IMPRESSION: 1. No acute osseous abnormality. 2. Evidence of prior postsurgical and posttraumatic deformity of the fibula. 3. Minimal periosteal reaction of the distal tibia without definitive fracture deformity 4. 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 Electronically Signed   By: Jasmine PangKim  Fujinaga M.D.   On: 04/12/2018 15:42    Cardiac Studies   2D Echo3/10/2017 There is normal left ventricular wall thickness. The left ventricular ejection fraction is mildly reduced (45-50%). Septal motion is consistent with conduction abnormality. The aortic valve is trileaflet. Unable to adequately determine diastolic dysfunction. The mitral valve leaflets appear thickened, but open well. There is mild-moderate (1-2+) mitral regurgitation. There is moderate (2+) tricuspid regurgitation. Right ventricular systolic pressure is elevated between 50-5160mm Hg, consistent with moderately severe pulmonary hypertension. The left atrium is moderately dilated. The right atrium is mildly dilated.  Patient Profile     57 y.o. female with a PMH ofchronic atrial fibrillation on Eliquis,noncompliance, alcoholism on CIWA protocol, ?HFrEF (09/2017 EF 45-50%), frequent falls, COPD (prescribed home oxygen), depression and anxiety,now with afib RVR.   Assessment & Plan    . Atrial Fibrillation: Has been difficult to control. Resting rates currently in the 110s-120s and HR jumps into the 140s-150s with ambulation, making pt very symptomatic w/ exertional dyspnea. Metoprolol has been increased to 100 mg BID. She remains on Cardizem 30 mg Q8Hs. Pt is on Eliquis for a/c butgiven her ETOH history, she may not be a good candidate for long term anticoagulation, given bleed and fall risk.In addition, there were concerns  regarding dark stools and + FOBT. GI w/u w/ EGD was normal except for small hiatal hernia  -- TEE/guided DDCV for tomorrow with Dr. Shirlee LatchMcLean at [phone]  and continuation of a/c for at least 1 month post cardioversion.  [facility] has been requested to perform a transesophageal echocardiogram on Laurie Lynch for atrial fib with DCCV.  After careful review of history and examination, the risks and benefits of transesophageal echocardiogram have been explained including risks of esophageal damage, perforation (1:10,000 risk), bleeding, pharyngeal hematoma as well as other potential complications associated with conscious sedation including aspiration, arrhythmia, respiratory failure and death. Alternatives to treatment were discussed, questions were answered. Patient is willing to proceed.   2. Acute on Chronic Combined Systolic and Diastolic HF: Echo 10/03/17 showed mildly reduced LVEF at 45-50%. Good diuresis thus far. Net I/Os negative 9.8L total. She does have tr LEE on exam and will need to continue PO lasix, 40 mg daily. Monitor I/Os closely.  Wt was 93 kg at peak and now 82.1 Kg  3. ZOX:WRUEAVWU today but at 0500 she was 141/100 now 112/68    4. ETOH Abuse: management per IM. On CIWA protocol.  5. Hypokalemia: Resolved w/ supplementation. K+ now up to 5.2 today   6. FOBT +: EGD was normal except for hiatal hernia      For questions or updates, please contact [facility] Please consult [email or web address] for contact info under        Signed, Nada Boozer, NP  04/13/2018, 1:10 PM    Pt. Seen and examined. Agree with the Resident/NP/PA-C note as written. Plan for TEE/DCCV tomorrow. Will have 5th consecutive dose of Eliquis tomorrow morning.  Time Spent with Patient: I have spent a total of 15 minutes with patient reviewing hospital notes, telemetry, EKGs, labs and examining the patient as well as establishing an assessment and plan that was discussed with the patient. > 50% of  time was spent in direct patient care.  Chrystie Nose, MD, [facility], FACP  [place]  [facility]  Medical Director of the Advanced Lipid Disorders &  Cardiovascular Risk Reduction Clinic Diplomate of the American Board of Clinical Lipidology Attending Cardiologist  Direct Dial: [phone]  Fax: [phone]  Website:  [email or web address]

## 2018-04-13 NOTE — Anesthesia Postprocedure Evaluation (Signed)
Anesthesia Post Note  Patient: Laurie Lynch  Procedure(s) Performed: ESOPHAGOGASTRODUODENOSCOPY (EGD) WITH PROPOFOL (N/A )     Patient location during evaluation: Endoscopy Anesthesia Type: MAC Level of consciousness: awake and alert Pain management: pain level controlled Vital Signs Assessment: post-procedure vital signs reviewed and stable Respiratory status: spontaneous breathing, nonlabored ventilation, respiratory function stable and patient connected to nasal cannula oxygen Cardiovascular status: stable and blood pressure returned to baseline Postop Assessment: no apparent nausea or vomiting Anesthetic complications: no    Last Vitals:  Vitals:   04/13/18 1627 04/13/18 1955  BP: 126/75 129/84  Pulse: 76 86  Resp:  18  Temp: 36.8 C 36.6 C  SpO2: 95% 96%    Last Pain:  Vitals:   04/13/18 1955  TempSrc: Oral  PainSc: [place]

## 2018-04-14 ENCOUNTER — Encounter (HOSPITAL_COMMUNITY)
Admission: EM | Discharge status: Against Medical Advice | Payer: Self-pay | Source: Home / Self Care | Attending: Internal Medicine

## 2018-04-14 ENCOUNTER — Inpatient Hospital Stay (HOSPITAL_COMMUNITY): Payer: Medicaid Other | Admitting: Anesthesiology

## 2018-04-14 ENCOUNTER — Encounter (HOSPITAL_COMMUNITY): Payer: Self-pay | Admitting: *Deleted

## 2018-04-14 ENCOUNTER — Inpatient Hospital Stay (HOSPITAL_COMMUNITY): Payer: Medicaid Other

## 2018-04-14 DIAGNOSIS — I517 Cardiomegaly: Secondary | ICD-10-CM

## 2018-04-14 DIAGNOSIS — I4891 Unspecified atrial fibrillation: Secondary | ICD-10-CM

## 2018-04-14 HISTORY — PX: CARDIOVERSION: SHX1299

## 2018-04-14 HISTORY — PX: TEE WITHOUT CARDIOVERSION: SHX5443

## 2018-04-14 LAB — BASIC METABOLIC PANEL
Anion gap: 9 (ref 5–15)
BUN: 11 mg/dL (ref 6–20)
CALCIUM: 8.7 mg/dL — AB (ref 8.9–10.3)
CO2: 30 mmol/L (ref 22–32)
CREATININE: 0.83 mg/dL (ref 0.44–1.00)
Chloride: 102 mmol/L (ref 98–111)
GFR calc non Af Amer: >60 mL/min (ref >60)
Glucose, Bld: 89 mg/dL (ref 70–99)
Potassium: 3.6 mmol/L (ref 3.5–5.1)
Sodium: 141 mmol/L (ref 135–145)

## 2018-04-14 LAB — SULFONYLUREA HYPOGLYCEMICS PANEL, SERUM
Acetohexamide: NEGATIVE ug/mL (ref 20–60)
CHLORPROPAMIDE: NEGATIVE ug/mL (ref 75–250)
GLYBURIDE: NEGATIVE ng/mL
Glimepiride: NEGATIVE ng/mL (ref 80–250)
Glipizide: NEGATIVE ng/mL (ref 200–1000)
Nateglinide: NEGATIVE ng/mL
Repaglinide: NEGATIVE ng/mL
Tolazamide: NEGATIVE ug/mL
Tolbutamide: NEGATIVE ug/mL (ref 40–100)

## 2018-04-14 LAB — GLUCOSE, CAPILLARY
GLUCOSE-CAPILLARY: 89 mg/dL (ref 70–99)
GLUCOSE-CAPILLARY: 110 mg/dL — AB (ref 70–99)
GLUCOSE-CAPILLARY: 95 mg/dL (ref 70–99)
Glucose-Capillary: 112 mg/dL — ABNORMAL HIGH (ref 70–99)
Glucose-Capillary: 113 mg/dL — ABNORMAL HIGH (ref 70–99)
Glucose-Capillary: 77 mg/dL (ref 70–99)

## 2018-04-14 LAB — PROTIME-INR
INR: 1.22
Prothrombin Time: 15.3 seconds — ABNORMAL HIGH (ref 11.4–15.2)

## 2018-04-14 SURGERY — CARDIOVERSION
Anesthesia: General

## 2018-04-14 MED ORDER — PROPOFOL 500 MG/50ML IV EMUL
INTRAVENOUS | Status: DC | PRN
  Administered 2018-04-14: 120 ug/kg/min via INTRAVENOUS

## 2018-04-14 MED ORDER — PROPOFOL 10 MG/ML IV BOLUS
INTRAVENOUS | Status: DC | PRN
  Administered 2018-04-14: 20 mg via INTRAVENOUS

## 2018-04-14 MED ORDER — DILTIAZEM HCL ER COATED BEADS 120 MG PO CP24
120.0000 mg | ORAL_CAPSULE | Freq: Every day | ORAL | Status: DC
  Administered 2018-04-15: 120 mg via ORAL
  Filled 2018-04-14 (×2): qty 1

## 2018-04-14 MED ORDER — LACTATED RINGERS IV SOLN
INTRAVENOUS | Status: DC
  Administered 2018-04-14: 11:00:00 via INTRAVENOUS

## 2018-04-14 MED ORDER — LIDOCAINE 2% (20 MG/ML) 5 ML SYRINGE
INTRAMUSCULAR | Status: DC | PRN
  Administered 2018-04-14: 60 mg via INTRAVENOUS

## 2018-04-14 NOTE — Progress Notes (Signed)
   04/14/18 1100  OT Visit Information  Last OT Received On 04/14/18  Reason Eval/Treat Not Completed Patient at procedure or test/ unavailable (pt currently off floor.)    Raynald KempKathryn Keni Elison, OT Acute Rehabilitation Services Pager: [phone] Office: [phone]

## 2018-04-14 NOTE — Anesthesia Preprocedure Evaluation (Addendum)
Anesthesia Evaluation  Patient identified by MRN, date of birth, ID band Patient awake    Reviewed: Allergy & Precautions, NPO status , Patient's Chart, lab work & pertinent test results  History of Anesthesia Complications Negative for: history of anesthetic complications  Airway Mallampati: II  TM Distance: >3 FB Neck ROM: Full    Dental  (+) Dental Advisory Given, Chipped,    Pulmonary sleep apnea , COPD,  COPD inhaler,    breath sounds clear to auscultation       Cardiovascular hypertension, Pt. on home beta blockers and Pt. on medications + CAD and +CHF  + dysrhythmias Atrial Fibrillation  Rhythm:Irregular Rate:Normal     Neuro/Psych negative neurological ROS  negative psych ROS   GI/Hepatic negative GI ROS, Neg liver ROS,   Endo/Other  negative endocrine ROS  Renal/GU negative Renal ROS  negative genitourinary   Musculoskeletal  (+) Arthritis ,   Abdominal   Peds  Hematology negative hematology ROS (+)   Anesthesia Other Findings   Reproductive/Obstetrics                            Anesthesia Physical Anesthesia Plan  ASA: III  Anesthesia Plan: General   Post-op Pain Management:    Induction: Intravenous  PONV Risk Score and Plan: 3 and Treatment may vary due to age or medical condition and Propofol infusion  Airway Management Planned: Mask and Natural Airway  Additional Equipment: None  Intra-op Plan:   Post-operative Plan:   Informed Consent: I have reviewed the patients History and Physical, chart, labs and discussed the procedure including the risks, benefits and alternatives for the proposed anesthesia with the patient or authorized representative who has indicated his/her understanding and acceptance.     Plan Discussed with: CRNA and Anesthesiologist  Anesthesia Plan Comments:        Anesthesia Quick Evaluation

## 2018-04-14 NOTE — CV Procedure (Signed)
Procedure: TEE  Indication: Atrial fibrillation  Sedation: Per anesthesiology  Findings: Please see echo section for full report.  Normal LV size with mild LV hypertrophy.  EF 55-60%, normal wall motion.  Normal RV size and systolic function.  Trivial TR, peak RV-RA gradient 20 mmHg. Trivial mitral regurgitation. Mildly calcified, trileaflet aortic valve with no stenosis or regurgitation. Moderate-severe left atrial enlargement, no LA appendage thrombus.  Moderate right atrial enlargement.  No PFO/ASD by color doppler.  Normal caliber aorta with mild plaque.   Impression: No LA thrombus, may proceed to DCCV.   Laurie AnconaDalton Shamir Lynch 04/14/2018 1:37 PM

## 2018-04-14 NOTE — Progress Notes (Signed)
Converted to sinus today - LV function normal. Feels better in sinus. Continue Eliquis anticoagulation. Will switch cardizem to 120 mg LA starting tomorrow. Ok from a cardiac standpoint for discharge. Follow-up with Dr. Antoine PocheHochrein or APP after discharge.  [facility] will sign off.   Medication Recommendations:  Eliquis 5 mg BID, Cardizem LA 120 mg daily, lasix 40 mg daily, metoprolol XL 100 mg BID Other recommendations (labs, testing, etc):  None Follow up as an outpatient:  Follow-up with Dr. Antoine PocheHochrein or APP in 2-3 weeks  Chrystie NoseKenneth C. Everleigh Colclasure, MD, [facility] HospitalFACC, FACP  [place]  [facility]  Medical Director of the Advanced Lipid Disorders &  Cardiovascular Risk Reduction Clinic Diplomate of the American Board of Clinical Lipidology Attending Cardiologist  Direct Dial: [phone]  Fax: [phone]  Website:  [email or web address]

## 2018-04-14 NOTE — Transfer of Care (Signed)
Immediate Anesthesia Transfer of Care Note  Patient: Laurie Lynch  Procedure(s) Performed: CARDIOVERSION (N/A ) TRANSESOPHAGEAL ECHOCARDIOGRAM (TEE) (N/A )  Patient Location: Endoscopy Unit  Anesthesia Type:MAC  Level of Consciousness: awake, alert  and oriented  Airway & Oxygen Therapy: Patient Spontanous Breathing  Post-op Assessment: Report given to RN and Post -op Vital signs reviewed and stable  Post vital signs: Reviewed and stable  Last Vitals:  Vitals Value Taken Time  BP    Temp    Pulse    Resp    SpO2      Last Pain:  Vitals:   04/14/18 1123  TempSrc: Oral  PainSc: 0-No pain      Patients Stated Pain Goal: 0 (08/65/78 4696)  Complications: No apparent anesthesia complications

## 2018-04-14 NOTE — Interval H&P Note (Signed)
History and Physical Interval Note:  04/14/2018 1:21 PM  Laurie Lynch  has presented today for surgery, with the diagnosis of afib  The various methods of treatment have been discussed with the patient and family. After consideration of risks, benefits and other options for treatment, the patient has consented to  Procedure(s): CARDIOVERSION (N/A) TRANSESOPHAGEAL ECHOCARDIOGRAM (TEE) (N/A) as a surgical intervention .  The patient's history has been reviewed, patient examined, no change in status, stable for surgery.  I have reviewed the patient's chart and labs.  Questions were answered to the patient's satisfaction.     Dalton Chesapeake EnergyMcLean

## 2018-04-14 NOTE — Procedures (Signed)
Electrical Cardioversion Procedure Note Laurie Lynch [id number] 04/29/1961  Procedure: Electrical Cardioversion Indications:  Atrial Fibrillation  Procedure Details Consent: Risks of procedure as well as the alternatives and risks of each were explained to the (patient/caregiver).  Consent for procedure obtained. Time Out: Verified patient identification, verified procedure, site/side was marked, verified correct patient position, special equipment/implants available, medications/allergies/relevent history reviewed, required imaging and test results available.  Performed  Patient placed on cardiac monitor, pulse oximetry, supplemental oxygen as necessary.  Sedation given: Propofol per anesthesiology Pacer pads placed anterior and posterior chest.  Cardioverted 1 time(s).  Cardioverted at 200J.  Evaluation Findings: Post procedure EKG shows: NSR Complications: None Patient did tolerate procedure well.   Laurie Lynch 04/14/2018, 1:37 PM

## 2018-04-14 NOTE — Progress Notes (Signed)
Continues in a fib for TEE, DCCV today

## 2018-04-14 NOTE — Progress Notes (Signed)
  Echocardiogram Echocardiogram Transesophageal has been performed.  [name], [name] M 04/14/2018, 1:41 PM

## 2018-04-14 NOTE — Anesthesia Procedure Notes (Signed)
Procedure Name: MAC Date/Time: 04/14/2018 1:15 PM Performed by: Marsa Aris, CRNA Pre-anesthesia Checklist: Patient identified, Emergency Drugs available, Suction available, Patient being monitored and Timeout performed Patient Re-evaluated:Patient Re-evaluated prior to induction Oxygen Delivery Method: Nasal cannula Preoxygenation: Pre-oxygenation with 100% oxygen Induction Type: IV induction Airway Equipment and Method: Bite block Placement Confirmation: positive ETCO2 Dental Injury: Teeth and Oropharynx as per pre-operative assessment

## 2018-04-14 NOTE — Progress Notes (Addendum)
PROGRESS NOTE    Laurie Lynch  ZOX:[id number] DOB: [birth year] DOA: 04/03/2018 PCP: No primary care provider on file.    Brief Narrative:  57 year old female who presented with abdominal pain, edema and melena. She does have significant past medical history for chronic atrial fibrillation, depression, anxiety, COPD and alcohol abuse. Reported periumbilical abdominal pain and melena. She had a recent hospitalization due to atrial fibrillation and acute kidney injury, her anticoagulants and diureticswere discontinued and diltiazem was reduced.Due to persistent symptoms EMS was called she was found in atrial fibrillation with rapid ventricular response andreceived metoprolol. On initial physical examination her heart rate was 110,pressure 151/79, respiratory rate 17, oxygen saturation 94%.Moist mucous membranes, lungs clear to auscultation bilaterally, heart S1-S2 present irregularly irregular, 120 bpm, abdomen tender in the mid abdomen, no rebound no guarding, positive pretibial pitting edema bilaterally.  Sodium 138, potassium 3.6, chloride 97, bicarb 23, glucose 52, BUN 5, creatinine 0.72, BNP 466, troponin I 0.03, white cell count 5.5, hemoglobin 12.3, hematocrit 37.8, platelets 352.  Urinalysis had specific gravity 1.033.  Alcohol level was 38, urine drug screen positive for opiates.  CT of the abdomen with no acute changes, severe hepatic steatosis.  Chest radiograph with left lower lobe atelectasis.  EKG with atrial fibrillation, normal axis, poor R wave progression.  Patient was admitted to the hospital with working diagnosis of atrial fibrillation with rapid ventricular response.   Assessment & Plan:   Principal Problem:   Atrial fibrillation with RVR (HCC) Active Problems:   Occult GI bleeding   Alcohol dependence with uncomplicated intoxication (HCC)   Hypoalbuminemia   Hypoglycemia without diagnosis of diabetes mellitus   Frequent falls   1. Atrial fibrillation with rapid  ventricular response. Patient scheduled for DC cardioversion today. Heart rate has been less than 100 bpm at rest, personally reviewed telemetry. On AV blockade with diltiazem 30 mg q 8 hours and metoprolol 100 mg bid (succinate). Anticoagulation with Apixaban.   2. Alcohol abuse. Continue as needed lorazepam. No agitation or confusion, no signs of withdrawal. On librium.   3. Depression. Continue buspirone.   4. Right ankle pain. Continue pain control with ibuprofen and topical diclofenac, follow with physical therapy for possible boot  5. Acute on chronic diastolic heart failure. Improved volume status, will continue diuresis with oral furosemide. Continue metoprolol and diltiazem. Scheduled for cardioversion today.  DVT prophylaxis:apixaban Code Status:full Family Communication:no family a the bedside Disposition Plan/ discharge barriers:patient will need SNF   Consultants:  Cardiology  Procedures:    Antimicrobials:   Subjective: Patient continue to have right ankle pain, worse with movement, sharp in nature, mild improvement with analgesics, no nausea or vomiting, no dyspnea or chest pain.   Objective: Vitals:   04/13/18 1435 04/13/18 1627 04/13/18 1955 04/14/18 0354  BP: (!) 119/57 126/75 129/84 107/76  Pulse: 76 76 86 98  Resp:   18 18  Temp:  98.3 F (36.8 C) 97.9 F (36.6 C) 97.7 F (36.5 C)  TempSrc:  Oral Oral Oral  SpO2:  95% 96% 94%  Weight:    82.3 kg  Height:        Intake/Output Summary (Last 24 hours) at 04/14/2018 0838 Last data filed at 04/14/2018 0355 Gross per 24 hour  Intake 723.5 ml  Output 1300 ml  Net -576.5 ml   Filed Weights   04/12/18 0430 04/13/18 0550 04/14/18 0354  Weight: 81.9 kg 82.1 kg 82.3 kg    Examination:   General: deconditioned and ill looking appearing  Neurology: Awake and alert, non focal  E ENT: mild pallor, no icterus, oral mucosa moist Cardiovascular: No JVD. S1-S2 present, rhythmic, no  gallops, rubs, or murmurs. No lower extremity edema. Pulmonary: positive breath sounds bilaterally, adequate air movement, no scattered expiratory wheezing at bases, but no rhonchi or rales. Gastrointestinal. Abdomen with no organomegaly, non tender, no rebound or guarding Skin. No rashes Musculoskeletal: right ankle with local tenderness and decreased range of motion.      Data Reviewed: I have personally reviewed following labs and imaging studies  CBC: Recent Labs  Lab 04/08/18 0706 04/12/18 0405  WBC 6.5 7.9  HGB 12.9 13.1  HCT 39.3 39.7  MCV 96.1 95.7  PLT 255 344   Basic Metabolic Panel: Recent Labs  Lab 04/09/18 0507 04/10/18 0545 04/11/18 0717 04/12/18 0405 04/13/18 0506 04/14/18 0637  NA 141 140 141 140 139 141  K 3.5 3.6 3.2* 3.5 5.2* 3.6  CL 97* 97* 98 102 105 102  CO2 32 31 30 29  21* 30  GLUCOSE 83 90 95 98 80 89  BUN 7 8 7 7 11 11   CREATININE 0.74 0.77 0.75 0.82 0.75 0.83  CALCIUM 8.2* 8.8* 8.6* 8.9 8.6* 8.7*  MG 1.6* 1.8 1.7 1.7 1.9  --    GFR: Estimated Creatinine Clearance: 84.7 mL/min (by C-G formula based on SCr of 0.83 mg/dL). Liver Function Tests: Recent Labs  Lab 04/08/18 0706 04/09/18 0507  AST 131* 86*  ALT 50* 38  ALKPHOS 134* 130*  BILITOT 1.1 0.8  PROT 5.4* 5.4*  ALBUMIN 2.5* 2.4*   No results for input(s): LIPASE, AMYLASE in the last 168 hours. No results for input(s): AMMONIA in the last 168 hours. Coagulation Profile: Recent Labs  Lab 04/14/18 0637  INR 1.22   Cardiac Enzymes: No results for input(s): CKTOTAL, CKMB, CKMBINDEX, TROPONINI in the last 168 hours. BNP (last 3 results) No results for input(s): PROBNP in the last 8760 hours. HbA1C: No results for input(s): HGBA1C in the last 72 hours. CBG: Recent Labs  Lab 04/13/18 1625 04/13/18 1953 04/14/18 0013 04/14/18 0351 04/14/18 0737  GLUCAP 93 102* 112* 89 77   Lipid Profile: No results for input(s): CHOL, HDL, LDLCALC, TRIG, CHOLHDL, LDLDIRECT in the last  72 hours. Thyroid Function Tests: No results for input(s): TSH, T4TOTAL, FREET4, T3FREE, THYROIDAB in the last 72 hours. Anemia Panel: No results for input(s): VITAMINB12, FOLATE, FERRITIN, TIBC, IRON, RETICCTPCT in the last 72 hours.    Radiology Studies: I have reviewed all of the imaging during this hospital visit personally     Scheduled Meds: . apixaban  5 mg Oral BID  . busPIRone  30 mg Oral BID  . chlordiazePOXIDE  10 mg Oral BID  . diclofenac sodium  2 g Topical QID  . diltiazem  30 mg Oral Q8H  . DULoxetine  60 mg Oral Daily  . feeding supplement (ENSURE ENLIVE)  237 mL Oral BID BM  . folic acid  1 mg Oral Daily  . furosemide  40 mg Oral Daily  . gabapentin  600 mg Oral TID  . ibuprofen  400 mg Oral TID  . LORazepam  0.5 mg Oral Once  . metoprolol succinate  100 mg Oral BID  . multivitamin with minerals  1 tablet Oral Daily  . pantoprazole  40 mg Oral BID AC  . roflumilast  500 mcg Oral Daily  . sodium chloride flush  3 mL Intravenous Q12H  . sodium chloride flush  3 mL Intravenous  Q12H  . vitamin B-1  250 mg Oral Daily  . vitamin B-12  500 mcg Oral Daily   Continuous Infusions: . sodium chloride    . sodium chloride 20 mL/hr at 04/14/18 0819  . sodium chloride 250 mL (04/13/18 2030)  . sodium chloride       LOS: 10 days        Coralie KeensMauricio Daniel Essence Merle, MD [facility] Pager [phone]

## 2018-04-15 DIAGNOSIS — I5031 Acute diastolic (congestive) heart failure: Secondary | ICD-10-CM

## 2018-04-15 DIAGNOSIS — J449 Chronic obstructive pulmonary disease, unspecified: Secondary | ICD-10-CM

## 2018-04-15 DIAGNOSIS — F329 Major depressive disorder, single episode, unspecified: Secondary | ICD-10-CM

## 2018-04-15 DIAGNOSIS — F10129 Alcohol abuse with intoxication, unspecified: Secondary | ICD-10-CM

## 2018-04-15 LAB — BASIC METABOLIC PANEL
Anion gap: 9 (ref 5–15)
BUN: 11 mg/dL (ref 6–20)
CHLORIDE: 102 mmol/L (ref 98–111)
CO2: 29 mmol/L (ref 22–32)
CREATININE: 0.74 mg/dL (ref 0.44–1.00)
Calcium: 8.8 mg/dL — ABNORMAL LOW (ref 8.9–10.3)
GFR calc non Af Amer: >60 mL/min (ref >60)
Glucose, Bld: 90 mg/dL (ref 70–99)
POTASSIUM: 3.9 mmol/L (ref 3.5–5.1)
Sodium: 140 mmol/L (ref 135–145)

## 2018-04-15 LAB — GLUCOSE, CAPILLARY
GLUCOSE-CAPILLARY: 99 mg/dL (ref 70–99)
Glucose-Capillary: 101 mg/dL — ABNORMAL HIGH (ref 70–99)
Glucose-Capillary: 86 mg/dL (ref 70–99)
Glucose-Capillary: 88 mg/dL (ref 70–99)

## 2018-04-15 MED ORDER — IBUPROFEN 400 MG PO TABS: 400.0000 mg | ORAL_TABLET | Freq: Three times a day (TID) | ORAL | 0 refills | Status: DC | PRN

## 2018-04-15 MED ORDER — METOPROLOL SUCCINATE ER 100 MG PO TB24: 100.0000 mg | ORAL_TABLET | Freq: Two times a day (BID) | ORAL | 0 refills | Status: AC

## 2018-04-15 MED ORDER — DILTIAZEM HCL ER COATED BEADS 120 MG PO CP24: 120.0000 mg | ORAL_CAPSULE | Freq: Every day | ORAL | 0 refills | Status: AC

## 2018-04-15 MED ORDER — DICLOFENAC SODIUM 1 % TD GEL: 2.0000 g | Freq: Four times a day (QID) | TRANSDERMAL | 0 refills | Status: DC

## 2018-04-15 MED ORDER — PANTOPRAZOLE SODIUM 40 MG PO TBEC: 40.0000 mg | DELAYED_RELEASE_TABLET | Freq: Every day | ORAL | 0 refills | Status: AC

## 2018-04-15 NOTE — Evaluation (Addendum)
Physical Therapy Evaluation Patient Details Name: Laurie Lynch MRN: [id number] DOB: April 25, 1961 Today's Date: 04/15/2018   History of Present Illness  Pt presented with abdominal pain and melena. Pt found to have A-fib with RVR and acute on chronic rt side heart failure. Pt with early signs of ETOH withdrawal and placed on CIWA protocol.  Pt with recent admit to [facility] with AKI and alcohol related problems. PMH - ETOH abuse, depression, copd  Clinical Impression  Patient re-evaluated by PT in the setting of referral for home health physical therapy at discharge. Patient had previously injured right ankle with ambulating. Now presenting with increased right ankle edema, redness, and pain with weightbearing; imaging negative for acute bony abnormality. Currently walking 60 feet with min guard assistance provided due to mild unsteadiness and antalgic gait pattern. HR 60-80's and BP 140/106 prior to mobility (RN aware). Based on history of falling and balance deficits, recommend HHPT to maximize functional independence and for home safety evaluation.    Follow Up Recommendations Home health PT;Supervision for mobility/OOB    Equipment Recommendations  None recommended by PT    Recommendations for Other Services       Precautions / Restrictions Precautions Precautions: Fall;Other (comment) Restrictions Weight Bearing Restrictions: No      Mobility  Bed Mobility               General bed mobility comments: Sitting EOB on arrival  Transfers Overall transfer level: Modified independent                  Ambulation/Gait Ambulation/Gait assistance: Min guard Gait Distance (Feet): 60 Feet Assistive device: None Gait Pattern/deviations: Step-through pattern;Decreased stride length;Decreased weight shift to right;Antalgic Gait velocity: decr   General Gait Details: patient with antalgic gait pattern secondary to right ankle pain with weightbearing causing mild  unsteadiness  Stairs            Wheelchair Mobility    Modified Rankin (Stroke Patients Only)       Balance Overall balance assessment: Mild deficits observed, not formally tested                                           Pertinent Vitals/Pain Pain Assessment: Faces Faces Pain Scale: Hurts a little bit Pain Location: right ankle with weightbearing Pain Descriptors / Indicators: Aching;Discomfort;Grimacing;Guarding Pain Intervention(s): Monitored during session;Premedicated before session    Home Living Family/patient expects to be discharged to:: Private residence Living Arrangements: Alone Available Help at Discharge: Friend(s);Available PRN/intermittently Type of Home: Apartment Home Access: Stairs to enter Entrance Stairs-Rails: Right;Left;Can reach both Entrance Stairs-Number of Steps: 18 Home Layout: One level Home Equipment: Walker - 2 wheels;Cane - single point;Bedside commode;Shower seat;Grab bars - tub/shower      Prior Function Level of Independence: Independent         Comments: independent with ADLs, IADls and mobility (does not drive)     Hand Dominance   Dominant Hand: Right    Extremity/Trunk Assessment   Upper Extremity Assessment Upper Extremity Assessment: Defer to OT evaluation    Lower Extremity Assessment Lower Extremity Assessment: RLE deficits/detail RLE Deficits / Details: Injured right ankle previously when ambulating. Imaging negative for acute bony abnormality. Noted increased redness and mild edema. Tenderness to palpation along metatarsals and increased pain with weightbearing. AROM WFL       Communication   Communication: No difficulties  Cognition Arousal/Alertness: Awake/alert Behavior During Therapy: Flat affect Overall Cognitive Status: No family/caregiver present to determine baseline cognitive functioning Area of Impairment: Attention;Safety/judgement;Awareness;Problem solving                    Current Attention Level: Selective     Safety/Judgement: Decreased awareness of deficits;Decreased awareness of safety Awareness: Emergent Problem Solving: Slow processing;Decreased initiation;Requires verbal cues General Comments: ETOH withdrawal      General Comments      Exercises     Assessment/Plan    PT Assessment Patient needs continued PT services  PT Problem List Decreased strength;Decreased activity tolerance;Decreased balance;Decreased mobility;Decreased cognition;Decreased safety awareness;Cardiopulmonary status limiting activity       PT Treatment Interventions DME instruction;Gait training;Stair training;Functional mobility training;Therapeutic activities;Therapeutic exercise;Balance training;Patient/family education    PT Goals (Current goals can be found in the Care Plan section)  Acute Rehab PT Goals Patient Stated Goal: none stated PT Goal Formulation: With patient Time For Goal Achievement: 04/29/18 Potential to Achieve Goals: Good    Frequency Min 3X/week   Barriers to discharge Inaccessible home environment;Decreased caregiver support      Co-evaluation               AM-PAC PT "6 Clicks" Daily Activity  Outcome Measure Difficulty turning over in bed (including adjusting bedclothes, sheets and blankets)?: None Difficulty moving from lying on back to sitting on the side of the bed? : None Difficulty sitting down on and standing up from a chair with arms (e.g., wheelchair, bedside commode, etc,.)?: None Help needed moving to and from a bed to chair (including a wheelchair)?: None Help needed walking in hospital room?: A Little Help needed climbing 3-5 steps with a railing? : A Little 6 Click Score: 22    End of Session Equipment Utilized During Treatment: Gait belt Activity Tolerance: Patient tolerated treatment well Patient left: in bed;with call bell/phone within reach;with nursing/sitter in room Nurse Communication: Mobility  status PT Visit Diagnosis: Unsteadiness on feet (R26.81);Other abnormalities of gait and mobility (R26.89);Muscle weakness (generalized) (M62.81)    Time: [id number] PT Time Calculation (min) (ACUTE ONLY): 13 min   Charges:   PT Evaluation $PT Eval Moderate Complexity: 1 Mod          Laurie Lynch, [place]PT, DPT Acute Rehabilitation Services Pager [phone] Office [phone]   Laurie Lynch 04/15/2018, 10:10 AM

## 2018-04-15 NOTE — Discharge Summary (Addendum)
Physician Discharge Summary  Laurie Lynch ZOX:[id number] DOB: 05-Apr-1961 DOA: 04/03/2018  PCP: No primary care provider on file.  Admit date: 04/03/2018 Discharge date: 04/15/2018  Admitted From: Home  Disposition:  Home   Recommendations for Outpatient Follow-up and new medication changes:  1. Follow up with Primary Care in 7 days. 2. Metoprolol has been increased to 100 mg bid (tartrate) 3. Diltiazem has been decreased to 120 mg daily (CD) 4. Added topical diclofenac for right ankle pain and as needed ibuprofen 5. Started on antiacid therapy with pantoprazole.  6. Follow up Dr. Antoine Poche in 2 to 3 weeks.   Home Health: Yes   Equipment/Devices: no    Discharge Condition: stable  CODE STATUS: Full  Diet recommendation:  Heart healthy.   Brief/Interim Summary: 57 year old female who presented with abdominal pain, edema and melena. She does have significant past medical history for chronic atrial fibrillation, depression, anxiety, COPD and alcohol abuse. Reported periumbilical abdominal pain and melena. She had a recent hospitalization due to atrial fibrillation and acute kidney injury, her anticoagulants and diureticswere discontinued and diltiazem was reduced.Due to persistent symptoms EMS was called she was found in atrial fibrillation with rapid ventricular response andreceived metoprolol. On initial physical examination her heart rate was 110,blood pressure 151/79, respiratory rate 17, oxygen saturation 94%.Moist mucous membranes, lungs clear to auscultation bilaterally, heart S1-S2 present irregularly irregular, 120 bpm, abdomen tender in the mid abdomen, no rebound no guarding, positive pretibial pitting edema bilaterally.Sodium 138, potassium 3.6, chloride 97, bicarb 23, glucose 52, BUN 5, creatinine 0.72, BNP 466, troponin I 0.03,white cell count 5.5, hemoglobin 12.3, hematocrit 37.8, platelets 352.Urinalysis had specific gravity 1.033.Alcohol level was 38, urine drug  screen positive for opiates.CT of the abdomen with no acute changes, severe hepatic steatosis.Chest radiograph with left lower lobe atelectasis.EKG with atrial fibrillation, normal axis, poor R wave progression.  Patient was admitted to the hospital with the working diagnosis of atrial fibrillation with rapid ventricular response complicated with melena.  1.  Atrial fibrillation with rapid ventricular response.  Patient was admitted to the telemetry unit, she received AV blockade with continuous intravenous infusion of diltiazem and po metoprolol.  Posteriorly she  transitioned to oral diltiazem combined with twice daily metoprolol.  Despite improvement in her heart rate she remained significantly symptomatic.  She underwent direct current cardioversion, returning to sinus rhythm and having significant improvement of her symptoms.  Patient will continue anticoagulation with apixaban.  2.  Acute on chronic diastolic heart failure, left ventricle ejection fraction 45 to 50%.  Patient was found volume overloaded, she received diuresis with improvement of volume status.  At discharge patient will continue metoprolol and furosemide.    3.  Melena.  Hemoglobin and hematocrit remained stable, patient did not require PRBC transfusion.  Further work-up with upper endoscopy showed normal stomach and normal duodenum (first, second and third portions).  Patient will continue once daily pantoprazole.  4.  Alcohol abuse with acute alcohol intoxication.  Patient received benzodiazepines with good toleration, no signs of active withdrawal.  5.  Right ankle pain.  Patient injured her ankle while ambulating, radiographs negative for acute bony abnormality, he had evidence of prior postsurgical and posttraumatic deformity of the fibula.  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  Patient was treated with nonsteroidal anti-inflammatory agents with topical diclofenac and oral ibuprofen with improvement of  her symptoms.  6.  Depression.  Continue buspirone, duloxetin and trazodone.   7. COPD. Stable with no exacerbation, continue bronchodilator therapy with  albuterol, and roflumilast.   8. Undetermined calorie protein malnutrition. Patient placed on nutritional supplements while in the hospital.   9. Hypokalemia and hypomagnesemia. Electrolytes were corrected.   Discharge Diagnoses:  Principal Problem:   Atrial fibrillation with RVR (HCC) Active Problems:   Occult GI bleeding   Alcohol dependence with uncomplicated intoxication (HCC)   Hypoalbuminemia   Hypoglycemia without diagnosis of diabetes mellitus   Frequent falls    Discharge Instructions   Allergies as of 04/15/2018   No Known Allergies     Medication List    STOP taking these medications   diltiazem 360 MG 24 hr capsule Commonly known as:  TIAZAC     TAKE these medications   albuterol (2.5 MG/3ML) 0.083% nebulizer solution Commonly known as:  PROVENTIL Take 2.5 mg by nebulization 2 (two) times daily as needed for wheezing or shortness of breath.   VENTOLIN HFA 108 (90 Base) MCG/ACT inhaler Generic drug:  albuterol Inhale 2 puffs into the lungs every 6 (six) hours as needed for wheezing or shortness of breath.   atorvastatin 40 MG tablet Commonly known as:  LIPITOR Take 40 mg by mouth at bedtime.   busPIRone 30 MG tablet Commonly known as:  BUSPAR Take 30 mg by mouth 2 (two) times daily.   diclofenac sodium 1 % Gel Commonly known as:  VOLTAREN Apply 2 g topically 4 (four) times daily. Apply to right ankle for 7 days.   diltiazem 120 MG 24 hr capsule Commonly known as:  CARDIZEM CD Take 1 capsule (120 mg total) by mouth daily.   DULoxetine 30 MG capsule Commonly known as:  CYMBALTA Take 60 mg by mouth daily.   ELIQUIS 5 MG Tabs tablet Generic drug:  apixaban Take 5 mg by mouth 2 (two) times daily.   FISH OIL PO Take 1 capsule by mouth daily.   folic acid 1 MG tablet Commonly known as:   FOLVITE Take 1 mg by mouth daily.   furosemide 40 MG tablet Commonly known as:  LASIX Take 40 mg by mouth daily.   gabapentin 300 MG capsule Commonly known as:  NEURONTIN Take 900 mg by mouth 2 (two) times daily.   ibuprofen 400 MG tablet Commonly known as:  ADVIL,MOTRIN Take 1 tablet (400 mg total) by mouth every 8 (eight) hours as needed (as needed for right ankle pain.).   Magnesium Oxide 500 MG Tabs Take 500 mg by mouth daily.   metoprolol succinate 100 MG 24 hr tablet Commonly known as:  TOPROL-XL Take 1 tablet (100 mg total) by mouth 2 (two) times daily. Take with or immediately following a meal. What changed:  when to take this   nitroGLYCERIN 0.4 MG SL tablet Commonly known as:  NITROSTAT Place 0.4 mg under the tongue every 5 (five) minutes x 3 doses as needed for chest pain.   pantoprazole 40 MG tablet Commonly known as:  PROTONIX Take 1 tablet (40 mg total) by mouth daily.   roflumilast 500 MCG Tabs tablet Commonly known as:  DALIRESP Take 500 mcg by mouth daily.   tetrahydrozoline-zinc 0.05-0.25 % ophthalmic solution Commonly known as:  VISINE-AC Place 2 drops into both eyes 3 (three) times daily as needed (for itching).   traZODone 100 MG tablet Commonly known as:  DESYREL Take 100 mg by mouth at bedtime.   vitamin B-1 250 MG tablet Take 250 mg by mouth daily.   vitamin B-12 500 MCG tablet Commonly known as:  CYANOCOBALAMIN Take 500 mcg by mouth  daily.      Follow-up Information    [facility].   Specialty:  Home Health Services Why:  Registered Nurse Contact information: [address] [place] [address] [phone]        Rollene RotundaHochrein, James, MD Follow up.   Specialty:  Cardiology Why:  the office should call you Monday for appt date and time if you have not heard by Tuesday please call the office.    Contact information: [address] [place] [address] [phone]           No Known Allergies  Consultations:  Cardiology   GI    Procedures/Studies: Dg Chest 2 View  Result Date: 04/03/2018 CLINICAL DATA:  Fatigue and atrial fibrillation. Shortness of breath. EXAM: CHEST - 2 VIEW COMPARISON:  None. FINDINGS: The heart size is borderline. The hila, mediastinum, lungs, and pleura are unremarkable. IMPRESSION: No active cardiopulmonary disease. Electronically Signed   By: Gerome Samavid  Williams III M.D   On: 04/03/2018 18:02   Dg Ankle 2 Views Right  Result Date: 04/12/2018 CLINICAL DATA:  Right ankle pain history of ORIF hardware removal EXAM: RIGHT ANKLE - 2 VIEW COMPARISON:  None. FINDINGS: No acute displaced fracture or malalignment. Ghost tracks within the distal fibula consistent with prior hardware. Old fracture deformity. Ankle mortise is grossly symmetric. Minimal periosteal reaction of the distal tibia. Diffuse soft tissue swelling. Sclerosis and lucency in the posterior calcaneus, possible bone infarct. Vascular calcifications. IMPRESSION: 1. No acute osseous abnormality. 2. Evidence of prior postsurgical and posttraumatic deformity of the fibula. 3. Minimal periosteal reaction of the distal tibia without definitive fracture deformity 4. 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 Electronically Signed   By: Jasmine PangKim  Fujinaga M.D.   On: 04/12/2018 15:42   Ct Abdomen Pelvis W Contrast  Result Date: 04/03/2018 CLINICAL DATA:  57 year old female with history of fatigue for the past 3 days. Black tarry stools. Peripheral edema. EXAM: CT ABDOMEN AND PELVIS WITH CONTRAST TECHNIQUE: Multidetector CT imaging of the abdomen and pelvis was performed using the standard protocol following bolus administration of intravenous contrast. CONTRAST:  100mL ISOVUE-300 IOPAMIDOL (ISOVUE-300) INJECTION 61% COMPARISON:  No priors. FINDINGS: Lower chest: Cardiomegaly. Atherosclerotic calcifications in the descending thoracic aorta, left circumflex and right coronary arteries.  Hepatobiliary: Diffuse low attenuation throughout the hepatic parenchyma, indicative of severe hepatic steatosis. No suspicious cystic or solid hepatic lesions. No intra or extrahepatic biliary ductal dilatation. Gallbladder is normal in appearance. Pancreas: No pancreatic mass. No pancreatic ductal dilatation. No pancreatic or peripancreatic fluid or inflammatory changes. Spleen: Unremarkable. Adrenals/Urinary Tract: Bilateral kidneys and bilateral adrenal glands are normal in appearance. No hydroureteronephrosis. Urinary bladder is normal in appearance. Stomach/Bowel: Normal appearance of the stomach. No pathologic dilatation of small bowel or colon. Numerous colonic diverticulae are noted, without surrounding inflammatory changes to suggest an acute diverticulitis at this time. Normal appendix. Vascular/Lymphatic: Aortic atherosclerosis, without evidence of aneurysm or dissection in the abdominal or pelvic vasculature. No lymphadenopathy noted in the abdomen or pelvis. Reproductive: Uterus and ovaries are unremarkable in appearance. Other: No significant volume of ascites.  No pneumoperitoneum. Musculoskeletal: There are no aggressive appearing lytic or blastic lesions noted in the visualized portions of the skeleton. IMPRESSION: 1. No acute findings are noted in the abdomen or pelvis to account for the patient's symptoms. 2. Severe hepatic steatosis. 3. Colonic diverticulosis without evidence of acute diverticulitis at this time. 4. Aortic atherosclerosis, in addition to least 2 vessel coronary artery  disease. Please note that although the presence of coronary artery calcium documents the presence of coronary artery disease, the severity of this disease and any potential stenosis cannot be assessed on this non-gated CT examination. Assessment for potential risk factor modification, dietary therapy or pharmacologic therapy may be warranted, if clinically indicated. 5. Cardiomegaly. Electronically Signed   By:  Trudie Reed M.D.   On: 04/03/2018 19:51   Dg Abd Portable 1v  Result Date: 04/05/2018 CLINICAL DATA:  Abdominal pain EXAM: PORTABLE ABDOMEN - 1 VIEW COMPARISON:  CT abdomen and pelvis April 03, 2018 FINDINGS: There is moderate stool in the colon. There is no bowel dilatation or air-fluid level to suggest bowel obstruction. No free air. No abnormal calcifications. IMPRESSION: No evident bowel obstruction or free air on this supine examination. Electronically Signed   By: Bretta Bang III M.D.   On: 04/05/2018 09:52       Subjective: Patient is feeling better, no further palpitations or dyspnea, no nausea or vomiting, right ankle pain has improved.   Discharge Exam: Vitals:   04/14/18 2139 04/15/18 0506  BP: 140/76 118/62  Pulse: 67 71  Resp:  18  Temp:  98.1 F (36.7 C)  SpO2:  96%   Vitals:   04/14/18 1524 04/14/18 2023 04/14/18 2139 04/15/18 0506  BP: 101/67 (!) 127/57 140/76 118/62  Pulse:  67 67 71  Resp:    18  Temp:  97.9 F (36.6 C)  98.1 F (36.7 C)  TempSrc:  Oral  Oral  SpO2:  98%  96%  Weight:    80.4 kg  Height:        General: Not in pain or dyspnea, Neurology: Awake and alert, non focal  E ENT: no pallor, no icterus, oral mucosa moist Cardiovascular: No JVD. S1-S2 present, rhythmic, no gallops, rubs, or murmurs. No lower extremity edema. Pulmonary: positive breath sounds bilaterally, adequate air movement, no wheezing, rhonchi or rales. Gastrointestinal. Abdomen with no organomegaly, non tender, no rebound or guarding Skin. No rashes Musculoskeletal: right ankle deformity.    The results of significant diagnostics from this hospitalization (including imaging, microbiology, ancillary and laboratory) are listed below for reference.     Microbiology: Recent Results (from the past 240 hour(s))  C difficile quick scan w PCR reflex     Status: None   Collection Time: 04/05/18 11:23 PM  Result Value Ref Range Status   C Diff antigen NEGATIVE  NEGATIVE Final   C Diff toxin NEGATIVE NEGATIVE Final   C Diff interpretation No C. difficile detected.  Final    Comment: Performed at [facility], [address], [place], [place] [address]     Labs: BNP (last 3 results) Recent Labs    04/03/18 1705  BNP 466.4*   Basic Metabolic Panel: Recent Labs  Lab 04/09/18 0507 04/10/18 0545 04/11/18 0717 04/12/18 0405 04/13/18 0506 04/14/18 0637 04/15/18 0537  NA 141 140 141 140 139 141 140  K 3.5 3.6 3.2* 3.5 5.2* 3.6 3.9  CL 97* 97* 98 102 105 102 102  CO2 32 31 30 29  21* 30 29  GLUCOSE 83 90 95 98 80 89 90  BUN 7 8 7 7 11 11 11   CREATININE 0.74 0.77 0.75 0.82 0.75 0.83 0.74  CALCIUM 8.2* 8.8* 8.6* 8.9 8.6* 8.7* 8.8*  MG 1.6* 1.8 1.7 1.7 1.9  --   --    Liver Function Tests: Recent Labs  Lab 04/09/18 0507  AST 86*  ALT  38  ALKPHOS 130*  BILITOT 0.8  PROT 5.4*  ALBUMIN 2.4*   No results for input(s): LIPASE, AMYLASE in the last 168 hours. No results for input(s): AMMONIA in the last 168 hours. CBC: Recent Labs  Lab 04/12/18 0405  WBC 7.9  HGB 13.1  HCT 39.7  MCV 95.7  PLT 344   Cardiac Enzymes: No results for input(s): CKTOTAL, CKMB, CKMBINDEX, TROPONINI in the last 168 hours. BNP: Invalid input(s): POCBNP CBG: Recent Labs  Lab 04/14/18 1703 04/14/18 2024 04/15/18 0051 04/15/18 0507 04/15/18 0800  GLUCAP 95 113* 101* 88 86   D-Dimer No results for input(s): DDIMER in the last 72 hours. Hgb A1c No results for input(s): HGBA1C in the last 72 hours. Lipid Profile No results for input(s): CHOL, HDL, LDLCALC, TRIG, CHOLHDL, LDLDIRECT in the last 72 hours. Thyroid function studies No results for input(s): TSH, T4TOTAL, T3FREE, THYROIDAB in the last 72 hours.  Invalid input(s): FREET3 Anemia work up No results for input(s): VITAMINB12, FOLATE, FERRITIN, TIBC, IRON, RETICCTPCT in the last 72 hours. Urinalysis    Component Value Date/Time   COLORURINE YELLOW 04/03/2018 2145   APPEARANCEUR  CLEAR 04/03/2018 2145   LABSPEC 1.033 (H) 04/03/2018 2145   PHURINE 6.0 04/03/2018 2145   GLUCOSEU NEGATIVE 04/03/2018 2145   HGBUR NEGATIVE 04/03/2018 2145   BILIRUBINUR NEGATIVE 04/03/2018 2145   KETONESUR 20 (A) 04/03/2018 2145   PROTEINUR NEGATIVE 04/03/2018 2145   NITRITE NEGATIVE 04/03/2018 2145   LEUKOCYTESUR NEGATIVE 04/03/2018 2145   Sepsis Labs Invalid input(s): PROCALCITONIN,  WBC,  LACTICIDVEN Microbiology Recent Results (from the past 240 hour(s))  C difficile quick scan w PCR reflex     Status: None   Collection Time: 04/05/18 11:23 PM  Result Value Ref Range Status   C Diff antigen NEGATIVE NEGATIVE Final   C Diff toxin NEGATIVE NEGATIVE Final   C Diff interpretation No C. difficile detected.  Final    Comment: Performed at [facility], [address], [place], [place] [address]     Time coordinating discharge: 45 minutes  SIGNED:   Coralie Keens, MD  [facility] 04/15/2018, 8:56 AM Pager [phone]  If 7PM-7AM, please contact night-coverage [email or web address] Password [email or web address]

## 2018-04-16 ENCOUNTER — Emergency Department (HOSPITAL_COMMUNITY): Payer: Medicaid Other

## 2018-04-16 ENCOUNTER — Encounter (HOSPITAL_COMMUNITY): Payer: Self-pay | Admitting: Emergency Medicine

## 2018-04-16 ENCOUNTER — Observation Stay (HOSPITAL_COMMUNITY)
Admission: EM | Admit: 2018-04-16 | Discharge: 2018-04-17 | Disposition: A | Discharge status: Home / Self Care | Payer: Medicaid Other | Attending: Internal Medicine | Admitting: Internal Medicine

## 2018-04-16 DIAGNOSIS — I2510 Atherosclerotic heart disease of native coronary artery without angina pectoris: Secondary | ICD-10-CM | POA: Insufficient documentation

## 2018-04-16 DIAGNOSIS — F329 Major depressive disorder, single episode, unspecified: Secondary | ICD-10-CM | POA: Diagnosis not present

## 2018-04-16 DIAGNOSIS — F1010 Alcohol abuse, uncomplicated: Secondary | ICD-10-CM | POA: Diagnosis not present

## 2018-04-16 DIAGNOSIS — I4891 Unspecified atrial fibrillation: Principal | ICD-10-CM

## 2018-04-16 DIAGNOSIS — J441 Chronic obstructive pulmonary disease with (acute) exacerbation: Secondary | ICD-10-CM | POA: Diagnosis not present

## 2018-04-16 DIAGNOSIS — Z7901 Long term (current) use of anticoagulants: Secondary | ICD-10-CM | POA: Insufficient documentation

## 2018-04-16 DIAGNOSIS — I5042 Chronic combined systolic (congestive) and diastolic (congestive) heart failure: Secondary | ICD-10-CM | POA: Diagnosis not present

## 2018-04-16 DIAGNOSIS — Z79899 Other long term (current) drug therapy: Secondary | ICD-10-CM | POA: Insufficient documentation

## 2018-04-16 DIAGNOSIS — I110 Hypertensive heart disease with heart failure: Secondary | ICD-10-CM | POA: Diagnosis not present

## 2018-04-16 DIAGNOSIS — J810 Acute pulmonary edema: Secondary | ICD-10-CM | POA: Diagnosis not present

## 2018-04-16 DIAGNOSIS — Z951 Presence of aortocoronary bypass graft: Secondary | ICD-10-CM | POA: Insufficient documentation

## 2018-04-16 DIAGNOSIS — R0602 Shortness of breath: Secondary | ICD-10-CM | POA: Diagnosis present

## 2018-04-16 DIAGNOSIS — F419 Anxiety disorder, unspecified: Secondary | ICD-10-CM | POA: Insufficient documentation

## 2018-04-16 DIAGNOSIS — J449 Chronic obstructive pulmonary disease, unspecified: Secondary | ICD-10-CM | POA: Insufficient documentation

## 2018-04-16 LAB — HEPATIC FUNCTION PANEL
ALT: 25 U/L (ref 0–44)
AST: 58 U/L — ABNORMAL HIGH (ref 15–41)
Albumin: 2.8 g/dL — ABNORMAL LOW (ref 3.5–5.0)
Alkaline Phosphatase: 106 U/L (ref 38–126)
BILIRUBIN DIRECT: 0.2 mg/dL (ref 0.0–0.2)
BILIRUBIN INDIRECT: 0.8 mg/dL (ref 0.3–0.9)
Total Bilirubin: 1 mg/dL (ref 0.3–1.2)
Total Protein: 6.5 g/dL (ref 6.5–8.1)

## 2018-04-16 LAB — I-STAT CHEM 8, ED
BUN: 8 mg/dL (ref 6–20)
CHLORIDE: 97 mmol/L — AB (ref 98–111)
CREATININE: 0.8 mg/dL (ref 0.44–1.00)
Calcium, Ion: 1.02 mmol/L — ABNORMAL LOW (ref 1.15–1.40)
GLUCOSE: 85 mg/dL (ref 70–99)
HEMATOCRIT: 41 % (ref 36.0–46.0)
Hemoglobin: 13.9 g/dL (ref 12.0–15.0)
POTASSIUM: 4.1 mmol/L (ref 3.5–5.1)
Sodium: 135 mmol/L (ref 135–145)
TCO2: 27 mmol/L (ref 22–32)

## 2018-04-16 LAB — CBC WITH DIFFERENTIAL/PLATELET
ABS IMMATURE GRANULOCYTES: 0.1 10*3/uL (ref 0.0–0.1)
Basophils Absolute: 0.1 10*3/uL (ref 0.0–0.1)
Basophils Relative: 1 %
Eosinophils Absolute: 0.3 10*3/uL (ref 0.0–0.7)
Eosinophils Relative: 3 %
HEMATOCRIT: 41.8 % (ref 36.0–46.0)
Hemoglobin: 13.2 g/dL (ref 12.0–15.0)
IMMATURE GRANULOCYTES: 1 %
LYMPHS ABS: 2.3 10*3/uL (ref 0.7–4.0)
LYMPHS PCT: 25 %
MCH: 30.8 pg (ref 26.0–34.0)
MCHC: 31.6 g/dL (ref 30.0–36.0)
MCV: 97.4 fL (ref 78.0–100.0)
MONOS PCT: 14 %
Monocytes Absolute: 1.3 10*3/uL — ABNORMAL HIGH (ref 0.1–1.0)
NEUTROS ABS: 5.1 10*3/uL (ref 1.7–7.7)
NEUTROS PCT: 56 %
Platelets: 426 10*3/uL — ABNORMAL HIGH (ref 150–400)
RBC: 4.29 MIL/uL (ref 3.87–5.11)
RDW: 16.1 % — ABNORMAL HIGH (ref 11.5–15.5)
WBC: 9.1 10*3/uL (ref 4.0–10.5)

## 2018-04-16 LAB — TROPONIN I
TROPONIN I: 0.06 ng/mL — AB (ref <0.03)
Troponin I: <0.03 ng/mL (ref <0.03)

## 2018-04-16 LAB — I-STAT TROPONIN, ED: Troponin i, poc: 0 ng/mL (ref 0.00–0.08)

## 2018-04-16 LAB — BRAIN NATRIURETIC PEPTIDE: B NATRIURETIC PEPTIDE 5: 371 pg/mL — AB (ref 0.0–100.0)

## 2018-04-16 LAB — MAGNESIUM: Magnesium: 1.2 mg/dL — ABNORMAL LOW (ref 1.7–2.4)

## 2018-04-16 MED ORDER — BUSPIRONE HCL 5 MG PO TABS
30.0000 mg | ORAL_TABLET | Freq: Two times a day (BID) | ORAL | Status: DC
  Administered 2018-04-16 (×2): 30 mg via ORAL
  Filled 2018-04-16 (×3): qty 6

## 2018-04-16 MED ORDER — FUROSEMIDE 40 MG PO TABS
40.0000 mg | ORAL_TABLET | Freq: Every day | ORAL | Status: DC
  Administered 2018-04-16 – 2018-04-17 (×2): 40 mg via ORAL
  Filled 2018-04-16 (×2): qty 1

## 2018-04-16 MED ORDER — ALBUTEROL SULFATE (2.5 MG/3ML) 0.083% IN NEBU: 5.0000 mg | INHALATION_SOLUTION | Freq: Once | RESPIRATORY_TRACT | Status: DC

## 2018-04-16 MED ORDER — MAGNESIUM SULFATE 2 GM/50ML IV SOLN: 2.0000 g | Freq: Once | INTRAVENOUS | Status: DC

## 2018-04-16 MED ORDER — TRAZODONE HCL 100 MG PO TABS
100.0000 mg | ORAL_TABLET | Freq: Every day | ORAL | Status: DC
  Administered 2018-04-16: 100 mg via ORAL
  Filled 2018-04-16: qty 1

## 2018-04-16 MED ORDER — ROFLUMILAST 500 MCG PO TABS
500.0000 ug | ORAL_TABLET | Freq: Every day | ORAL | Status: DC
  Administered 2018-04-16 – 2018-04-17 (×2): 500 ug via ORAL
  Filled 2018-04-16 (×2): qty 1

## 2018-04-16 MED ORDER — LEVALBUTEROL HCL 1.25 MG/0.5ML IN NEBU
1.2500 mg | INHALATION_SOLUTION | Freq: Three times a day (TID) | RESPIRATORY_TRACT | Status: DC
  Administered 2018-04-16 (×2): 1.25 mg via RESPIRATORY_TRACT
  Filled 2018-04-16 (×3): qty 0.5

## 2018-04-16 MED ORDER — DULOXETINE HCL 60 MG PO CPEP
60.0000 mg | ORAL_CAPSULE | Freq: Every day | ORAL | Status: DC
  Administered 2018-04-16 – 2018-04-17 (×2): 60 mg via ORAL
  Filled 2018-04-16 (×2): qty 1

## 2018-04-16 MED ORDER — DILTIAZEM LOAD VIA INFUSION
10.0000 mg | Freq: Once | INTRAVENOUS | Status: AC
  Administered 2018-04-16: 10 mg via INTRAVENOUS
  Filled 2018-04-16: qty 10

## 2018-04-16 MED ORDER — BUTALBITAL-APAP-CAFFEINE 50-325-40 MG PO TABS
1.0000 | ORAL_TABLET | Freq: Four times a day (QID) | ORAL | Status: DC | PRN
  Administered 2018-04-16: 1 via ORAL
  Filled 2018-04-16: qty 1

## 2018-04-16 MED ORDER — SODIUM CHLORIDE 0.9 % IV SOLN
500.0000 mg | INTRAVENOUS | Status: DC
  Administered 2018-04-16: 500 mg via INTRAVENOUS
  Filled 2018-04-16 (×2): qty 500

## 2018-04-16 MED ORDER — FOLIC ACID 5 MG/ML IJ SOLN
1.0000 mg | Freq: Every day | INTRAMUSCULAR | Status: DC
  Filled 2018-04-16: qty 0.2

## 2018-04-16 MED ORDER — LEVALBUTEROL HCL 1.25 MG/0.5ML IN NEBU: 1.2500 mg | INHALATION_SOLUTION | Freq: Four times a day (QID) | RESPIRATORY_TRACT | Status: DC | PRN

## 2018-04-16 MED ORDER — ACETAMINOPHEN 325 MG PO TABS: 650.0000 mg | ORAL_TABLET | Freq: Four times a day (QID) | ORAL | Status: DC | PRN

## 2018-04-16 MED ORDER — SODIUM CHLORIDE 0.9% FLUSH
3.0000 mL | Freq: Two times a day (BID) | INTRAVENOUS | Status: DC
  Administered 2018-04-16 – 2018-04-17 (×3): 3 mL via INTRAVENOUS

## 2018-04-16 MED ORDER — ACETAMINOPHEN 650 MG RE SUPP: 650.0000 mg | Freq: Four times a day (QID) | RECTAL | Status: DC | PRN

## 2018-04-16 MED ORDER — LEVALBUTEROL HCL 1.25 MG/0.5ML IN NEBU
1.2500 mg | INHALATION_SOLUTION | Freq: Four times a day (QID) | RESPIRATORY_TRACT | Status: DC
  Administered 2018-04-16: 1.25 mg via RESPIRATORY_TRACT
  Filled 2018-04-16: qty 0.5

## 2018-04-16 MED ORDER — FOLIC ACID 1 MG PO TABS
1.0000 mg | ORAL_TABLET | Freq: Every day | ORAL | Status: DC
  Administered 2018-04-16 – 2018-04-17 (×2): 1 mg via ORAL
  Filled 2018-04-16 (×2): qty 1

## 2018-04-16 MED ORDER — NAPHAZOLINE-GLYCERIN 0.012-0.2 % OP SOLN
1.0000 [drp] | Freq: Four times a day (QID) | OPHTHALMIC | Status: DC | PRN
  Filled 2018-04-16: qty 15

## 2018-04-16 MED ORDER — VITAMIN B-1 100 MG PO TABS: 250.0000 mg | ORAL_TABLET | Freq: Every day | ORAL | Status: DC

## 2018-04-16 MED ORDER — VITAMIN B-1 100 MG PO TABS
100.0000 mg | ORAL_TABLET | Freq: Every day | ORAL | Status: DC
  Administered 2018-04-16 – 2018-04-17 (×2): 100 mg via ORAL
  Filled 2018-04-16 (×2): qty 1

## 2018-04-16 MED ORDER — LEVALBUTEROL HCL 1.25 MG/0.5ML IN NEBU: 1.2500 mg | INHALATION_SOLUTION | RESPIRATORY_TRACT | Status: DC | PRN

## 2018-04-16 MED ORDER — ATORVASTATIN CALCIUM 40 MG PO TABS
40.0000 mg | ORAL_TABLET | Freq: Every day | ORAL | Status: DC
  Administered 2018-04-16: 40 mg via ORAL
  Filled 2018-04-16: qty 1

## 2018-04-16 MED ORDER — FUROSEMIDE 10 MG/ML IJ SOLN
40.0000 mg | Freq: Once | INTRAMUSCULAR | Status: AC
  Administered 2018-04-16: 40 mg via INTRAVENOUS
  Filled 2018-04-16: qty 4

## 2018-04-16 MED ORDER — MAGNESIUM OXIDE 400 (241.3 MG) MG PO TABS
400.0000 mg | ORAL_TABLET | Freq: Every day | ORAL | Status: DC
  Administered 2018-04-16: 400 mg via ORAL
  Filled 2018-04-16: qty 1

## 2018-04-16 MED ORDER — METOPROLOL SUCCINATE ER 100 MG PO TB24
100.0000 mg | ORAL_TABLET | Freq: Two times a day (BID) | ORAL | Status: DC
  Administered 2018-04-16 – 2018-04-17 (×3): 100 mg via ORAL
  Filled 2018-04-16 (×3): qty 1

## 2018-04-16 MED ORDER — DILTIAZEM HCL-DEXTROSE 100-5 MG/100ML-% IV SOLN (PREMIX)
5.0000 mg/h | INTRAVENOUS | Status: DC
  Administered 2018-04-16: 15 mg/h via INTRAVENOUS
  Administered 2018-04-16: 5 mg/h via INTRAVENOUS
  Administered 2018-04-16: 10 mg/h via INTRAVENOUS
  Administered 2018-04-16 – 2018-04-17 (×2): 15 mg/h via INTRAVENOUS
  Filled 2018-04-16 (×5): qty 100

## 2018-04-16 MED ORDER — MAGNESIUM SULFATE 4 GM/100ML IV SOLN
4.0000 g | Freq: Once | INTRAVENOUS | Status: AC
  Administered 2018-04-16: 4 g via INTRAVENOUS
  Filled 2018-04-16: qty 100

## 2018-04-16 MED ORDER — ACETAMINOPHEN 325 MG PO TABS
650.0000 mg | ORAL_TABLET | Freq: Four times a day (QID) | ORAL | Status: DC | PRN
  Administered 2018-04-16: 650 mg via ORAL
  Filled 2018-04-16 (×2): qty 2

## 2018-04-16 MED ORDER — SODIUM CHLORIDE 0.9 % IV SOLN: 250.0000 mL | INTRAVENOUS | Status: DC | PRN

## 2018-04-16 MED ORDER — TIOTROPIUM BROMIDE MONOHYDRATE 18 MCG IN CAPS
18.0000 ug | ORAL_CAPSULE | Freq: Every day | RESPIRATORY_TRACT | Status: DC
  Administered 2018-04-16: 18 ug via RESPIRATORY_TRACT
  Filled 2018-04-16: qty 5

## 2018-04-16 MED ORDER — THIAMINE HCL 100 MG/ML IJ SOLN
100.0000 mg | Freq: Every day | INTRAMUSCULAR | Status: DC
  Filled 2018-04-16: qty 2

## 2018-04-16 MED ORDER — LORAZEPAM 2 MG/ML IJ SOLN
2.0000 mg | INTRAMUSCULAR | Status: DC | PRN
  Administered 2018-04-16: 2 mg via INTRAVENOUS
  Filled 2018-04-16: qty 1

## 2018-04-16 MED ORDER — VITAMIN B-12 100 MCG PO TABS
500.0000 ug | ORAL_TABLET | Freq: Every day | ORAL | Status: DC
  Administered 2018-04-16: 500 ug via ORAL
  Filled 2018-04-16: qty 5

## 2018-04-16 MED ORDER — PANTOPRAZOLE SODIUM 40 MG PO TBEC
40.0000 mg | DELAYED_RELEASE_TABLET | Freq: Every day | ORAL | Status: DC
  Administered 2018-04-16 – 2018-04-17 (×2): 40 mg via ORAL
  Filled 2018-04-16 (×2): qty 1

## 2018-04-16 MED ORDER — SODIUM CHLORIDE 0.9% FLUSH: 3.0000 mL | INTRAVENOUS | Status: DC | PRN

## 2018-04-16 MED ORDER — APIXABAN 5 MG PO TABS
5.0000 mg | ORAL_TABLET | Freq: Two times a day (BID) | ORAL | Status: DC
  Administered 2018-04-16 – 2018-04-17 (×3): 5 mg via ORAL
  Filled 2018-04-16 (×3): qty 1

## 2018-04-16 MED ORDER — METHYLPREDNISOLONE SODIUM SUCC 125 MG IJ SOLR
80.0000 mg | Freq: Three times a day (TID) | INTRAMUSCULAR | Status: DC
  Administered 2018-04-16 (×2): 80 mg via INTRAVENOUS
  Filled 2018-04-16 (×2): qty 2

## 2018-04-16 NOTE — Progress Notes (Signed)
CRITICAL VALUE ALERT  Critical Value: trop 0.06  Date & Time Notied:  1642  Provider Notified: Cardiology pa  Orders Received/Actions taken: No new orders received.

## 2018-04-16 NOTE — Progress Notes (Signed)
ANTICOAGULATION CONSULT NOTE - Initial Consult  Pharmacy Consult for Eliquis Indication: atrial fibrillation  No Known Allergies  Patient Measurements: Height: 5\' 9"  (175.3 cm) Weight: 180 lb 12.4 oz (82 kg) IBW/kg (Calculated) : 66.2  Vital Signs: Temp: 98.6 F (37 C) (09/15 0633) Temp Source: Axillary (09/15 0633) BP: 134/73 (09/15 16100633) Pulse Rate: 92 (09/15 0633)  Labs: Recent Labs    04/14/18 0637 04/15/18 0537 04/16/18 0322 04/16/18 0329  HGB  --   --  13.2 13.9  HCT  --   --  41.8 41.0  PLT  --   --  426*  --   LABPROT 15.3*  --   --   --   INR 1.22  --   --   --   CREATININE 0.83 0.74  --  0.80    Estimated Creatinine Clearance: 87.7 mL/min (by C-G formula based on SCr of 0.8 mg/dL).   Medical History: Past Medical History:  Diagnosis Date  . Arthritis   . CHF (congestive heart failure) (HCC)   . COPD (chronic obstructive pulmonary disease) (HCC)   . Coronary artery disease   . Hypertension   . Sleep apnea     Medications:  Current Facility-Administered Medications on File Prior to Encounter  Medication Dose Route Frequency Provider Last Rate Last Dose  . [DISCONTINUED] 0.9 %  sodium chloride infusion  250 mL Intravenous Continuous Allayne ButcherSimmons, Brittainy M, PA-C 1 mL/hr at 04/13/18 2030 250 mL at 04/13/18 2030  . [DISCONTINUED] acetaminophen (TYLENOL) suppository 650 mg  650 mg Rectal Q6H PRN Vida RiggerMagod, Marc, MD      . [DISCONTINUED] acetaminophen (TYLENOL) tablet 650 mg  650 mg Oral Q6H PRN Vida RiggerMagod, Marc, MD   650 mg at 04/13/18 0904  . [DISCONTINUED] albuterol (PROVENTIL) (2.5 MG/3ML) 0.083% nebulizer solution 2.5 mg  2.5 mg Nebulization Q4H PRN Vida RiggerMagod, Marc, MD   2.5 mg at 04/15/18 1037  . [DISCONTINUED] apixaban (ELIQUIS) tablet 5 mg  5 mg Oral BID Robbie LisSimmons, Brittainy M, PA-C   5 mg at 04/15/18 0943  . [DISCONTINUED] busPIRone (BUSPAR) tablet 30 mg  30 mg Oral BID Vida RiggerMagod, Marc, MD   30 mg at 04/15/18 0939  . [DISCONTINUED] chlordiazePOXIDE (LIBRIUM) capsule 10  mg  10 mg Oral BID Vida RiggerMagod, Marc, MD   10 mg at 04/15/18 0941  . [DISCONTINUED] diclofenac sodium (VOLTAREN) 1 % transdermal gel 2 g  2 g Topical QID Arrien, York RamMauricio Daniel, MD   2 g at 04/15/18 0948  . [DISCONTINUED] diltiazem (CARDIZEM CD) 24 hr capsule 120 mg  120 mg Oral Daily Chrystie NoseHilty, Kenneth C, MD   120 mg at 04/15/18 0943  . [DISCONTINUED] diltiazem (CARDIZEM) injection 10 mg  10 mg Intravenous Q6H PRN Vida RiggerMagod, Marc, MD   10 mg at 04/11/18 0052  . [DISCONTINUED] DULoxetine (CYMBALTA) DR capsule 60 mg  60 mg Oral Daily Vida RiggerMagod, Marc, MD   60 mg at 04/15/18 0945  . [DISCONTINUED] feeding supplement (ENSURE ENLIVE) (ENSURE ENLIVE) liquid 237 mL  237 mL Oral BID BM Vida RiggerMagod, Marc, MD   237 mL at 04/13/18 2039  . [DISCONTINUED] folic acid (FOLVITE) tablet 1 mg  1 mg Oral Daily Vida RiggerMagod, Marc, MD   1 mg at 04/15/18 0944  . [DISCONTINUED] furosemide (LASIX) tablet 40 mg  40 mg Oral Daily Vida RiggerMagod, Marc, MD   40 mg at 04/15/18 0943  . [DISCONTINUED] gabapentin (NEURONTIN) capsule 600 mg  600 mg Oral TID Vida RiggerMagod, Marc, MD   600 mg at 04/15/18 0942  . [  DISCONTINUED] HYDROcodone-acetaminophen (NORCO/VICODIN) 5-325 MG per tablet 1 tablet  1 tablet Oral Q6H PRN Vida Rigger, MD   1 tablet at 04/15/18 0956  . [DISCONTINUED] ibuprofen (ADVIL,MOTRIN) tablet 400 mg  400 mg Oral TID Coralie Keens, MD   400 mg at 04/15/18 0942  . [DISCONTINUED] LORazepam (ATIVAN) tablet 0.5 mg  0.5 mg Oral BID PRN Vida Rigger, MD   0.5 mg at 04/13/18 2223  . [DISCONTINUED] LORazepam (ATIVAN) tablet 0.5 mg  0.5 mg Oral Once Vida Rigger, MD      . [DISCONTINUED] metoprolol succinate (TOPROL-XL) 24 hr tablet 100 mg  100 mg Oral BID Vida Rigger, MD   100 mg at 04/15/18 0943  . [DISCONTINUED] metoprolol tartrate (LOPRESSOR) injection 5 mg  5 mg Intravenous Q6H PRN Vida Rigger, MD   5 mg at 04/11/18 1929  . [DISCONTINUED] multivitamin with minerals tablet 1 tablet  1 tablet Oral Daily Vida Rigger, MD   1 tablet at 04/15/18 0943  .  [DISCONTINUED] naphazoline-glycerin (CLEAR EYES REDNESS) ophth solution 2 drop  2 drop Both Eyes QID PRN Vida Rigger, MD      . [DISCONTINUED] ondansetron [facility]) injection 4 mg  4 mg Intravenous Q6H PRN Vida Rigger, MD      . [DISCONTINUED] pantoprazole (PROTONIX) EC tablet 40 mg  40 mg Oral BID AC Vida Rigger, MD   40 mg at 04/15/18 0944  . [DISCONTINUED] roflumilast (DALIRESP) tablet 500 mcg  500 mcg Oral Daily Vida Rigger, MD   500 mcg at 04/15/18 0942  . [DISCONTINUED] senna-docusate (Senokot-S) tablet 1 tablet  1 tablet Oral QHS PRN Vida Rigger, MD      . [DISCONTINUED] sodium chloride flush (NS) 0.9 % injection 3 mL  3 mL Intravenous Q12H Vida Rigger, MD   3 mL at 04/14/18 2145  . [DISCONTINUED] sodium chloride flush (NS) 0.9 % injection 3 mL  3 mL Intravenous Q12H Simmons, Brittainy M, PA-C   3 mL at 04/15/18 1000  . [DISCONTINUED] sodium chloride flush (NS) 0.9 % injection 3 mL  3 mL Intravenous PRN Robbie Lis M, PA-C      . [DISCONTINUED] thiamine (VITAMIN B-1) tablet 250 mg  250 mg Oral Daily Vida Rigger, MD   250 mg at 04/15/18 0940  . [DISCONTINUED] vitamin B-12 (CYANOCOBALAMIN) tablet 500 mcg  500 mcg Oral Daily Vida Rigger, MD   500 mcg at 04/15/18 1610   Current Outpatient Medications on File Prior to Encounter  Medication Sig Dispense Refill  . albuterol (PROVENTIL) (2.5 MG/3ML) 0.083% nebulizer solution Take 2.5 mg by nebulization 2 (two) times daily as needed for wheezing or shortness of breath.     Marland Kitchen albuterol (VENTOLIN HFA) 108 (90 Base) MCG/ACT inhaler Inhale 2 puffs into the lungs every 6 (six) hours as needed for wheezing or shortness of breath.    Marland Kitchen apixaban (ELIQUIS) 5 MG TABS tablet Take 5 mg by mouth 2 (two) times daily.    Marland Kitchen atorvastatin (LIPITOR) 40 MG tablet Take 40 mg by mouth at bedtime.    . busPIRone (BUSPAR) 30 MG tablet Take 30 mg by mouth 2 (two) times daily.    . diclofenac sodium (VOLTAREN) 1 % GEL Apply 2 g topically 4 (four) times daily. Apply to  right ankle for 7 days. 1 Tube 0  . diltiazem (CARDIZEM CD) 120 MG 24 hr capsule Take 1 capsule (120 mg total) by mouth daily. 30 capsule 0  . DULoxetine (CYMBALTA) 30 MG capsule Take 60 mg by mouth  daily.    . folic acid (FOLVITE) 1 MG tablet Take 1 mg by mouth daily.    . furosemide (LASIX) 40 MG tablet Take 40 mg by mouth daily.    Marland Kitchen gabapentin (NEURONTIN) 300 MG capsule Take 900 mg by mouth 2 (two) times daily.    Marland Kitchen ibuprofen (ADVIL,MOTRIN) 400 MG tablet Take 1 tablet (400 mg total) by mouth every 8 (eight) hours as needed (as needed for right ankle pain.). 20 tablet 0  . Magnesium Oxide 500 MG TABS Take 500 mg by mouth daily.     . metoprolol succinate (TOPROL-XL) 100 MG 24 hr tablet Take 1 tablet (100 mg total) by mouth 2 (two) times daily. Take with or immediately following a meal. 60 tablet 0  . nitroGLYCERIN (NITROSTAT) 0.4 MG SL tablet Place 0.4 mg under the tongue every 5 (five) minutes x 3 doses as needed for chest pain.    . Omega-3 Fatty Acids (FISH OIL PO) Take 1 capsule by mouth daily.    . pantoprazole (PROTONIX) 40 MG tablet Take 1 tablet (40 mg total) by mouth daily. 30 tablet 0  . roflumilast (DALIRESP) 500 MCG TABS tablet Take 500 mcg by mouth daily.    Marland Kitchen tetrahydrozoline-zinc (VISINE-AC) 0.05-0.25 % ophthalmic solution Place 2 drops into both eyes 3 (three) times daily as needed (for itching).    . Thiamine HCl (VITAMIN B-1) 250 MG tablet Take 250 mg by mouth daily.    . traZODone (DESYREL) 100 MG tablet Take 100 mg by mouth at bedtime.    . vitamin B-12 (CYANOCOBALAMIN) 500 MCG tablet Take 500 mcg by mouth daily.       Assessment: 57 y.o. female with Afib to continue Eliquis  Plan:  Eliquis 5 mg BID  Eddie Candle 04/16/2018,6:55 AM

## 2018-04-16 NOTE — ED Triage Notes (Signed)
BIB [facility] with c/o of shortness of breath. Pt discharge yesterday from [facility]. Hx of Afib and currently taking Elliquis.

## 2018-04-16 NOTE — ED Provider Notes (Signed)
[facility] EMERGENCY DEPARTMENT Provider Note   CSN: [id number] Arrival date & time: 04/16/18  4098     History   Chief Complaint Chief Complaint  Patient presents with  . Shortness of Breath    HPI Laurie Lynch is a 57 y.o. female.  The history is provided by the EMS personnel. The history is limited by the condition of the patient.  Shortness of Breath  This is a recurrent problem. The problem occurs continuously.The current episode started 6 to 12 hours ago. The problem has not changed since onset.Associated symptoms include cough and wheezing. The problem's precipitants include medical treatment. Risk factors: none. She has tried nothing for the symptoms. The treatment provided no relief.  D/c yesterday post AFIB cardioversion.  Now back with AFIB and SOB.    Past Medical History:  Diagnosis Date  . Arthritis   . CHF (congestive heart failure) (HCC)   . COPD (chronic obstructive pulmonary disease) (HCC)   . Coronary artery disease   . Hypertension   . Sleep apnea     Patient Active Problem List   Diagnosis Date Noted  . Occult GI bleeding 04/03/2018  . Atrial fibrillation with RVR (HCC) 04/03/2018  . Alcohol dependence with uncomplicated intoxication (HCC) 04/03/2018  . Hypoalbuminemia 04/03/2018  . Hypoglycemia without diagnosis of diabetes mellitus 04/03/2018  . Frequent falls 04/03/2018  . Periumbilical abdominal pain     Past Surgical History:  Procedure Laterality Date  . ABDOMINAL SURGERY    . 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  She reports CABG at [facility].  No records in Care Everywhere  . CORONARY ARTERY BYPASS GRAFT    . ESOPHAGOGASTRODUODENOSCOPY (EGD) WITH PROPOFOL N/A 04/12/2018   Procedure: ESOPHAGOGASTRODUODENOSCOPY (EGD) WITH PROPOFOL;  Surgeon: Vida Rigger, MD;  Location: [facility] ENDOSCOPY;  Service: Endoscopy;  Laterality: N/A;  .  FRACTURE SURGERY       OB History   None      Home Medications    Prior to Admission medications   Medication Sig Start Date End Date Taking? Authorizing Provider  albuterol (PROVENTIL) (2.5 MG/3ML) 0.083% nebulizer solution Take 2.5 mg by nebulization 2 (two) times daily as needed for wheezing or shortness of breath.     [provider]  albuterol (VENTOLIN HFA) 108 (90 Base) MCG/ACT inhaler Inhale 2 puffs into the lungs every 6 (six) hours as needed for wheezing or shortness of breath.    [provider]  apixaban (ELIQUIS) 5 MG TABS tablet Take 5 mg by mouth 2 (two) times daily.    [provider]  atorvastatin (LIPITOR) 40 MG tablet Take 40 mg by mouth at bedtime.    [provider]  busPIRone (BUSPAR) 30 MG tablet Take 30 mg by mouth 2 (two) times daily.    [provider]  diclofenac sodium (VOLTAREN) 1 % GEL Apply 2 g topically 4 (four) times daily. Apply to right ankle for 7 days. 04/15/18   Arrien, York Ram, MD  diltiazem (CARDIZEM CD) 120 MG 24 hr capsule Take 1 capsule (120 mg total) by mouth daily. 04/15/18 05/15/18  Arrien, York Ram, MD  DULoxetine (CYMBALTA) 30 MG capsule Take 60 mg by mouth daily.    [provider]  folic acid (FOLVITE) 1 MG tablet Take 1 mg by mouth daily.    [provider]  furosemide (LASIX) 40 MG tablet Take 40 mg by  mouth daily.    [provider]  gabapentin (NEURONTIN) 300 MG capsule Take 900 mg by mouth 2 (two) times daily.    [provider]  ibuprofen (ADVIL,MOTRIN) 400 MG tablet Take 1 tablet (400 mg total) by mouth every 8 (eight) hours as needed (as needed for right ankle pain.). 04/15/18   Arrien, York Ram, MD  Magnesium Oxide 500 MG TABS Take 500 mg by mouth daily.     [provider]  metoprolol succinate (TOPROL-XL) 100 MG 24 hr tablet Take 1 tablet (100 mg total) by mouth 2 (two) times daily. Take with or immediately following a  meal. 04/15/18 05/15/18  Arrien, York Ram, MD  nitroGLYCERIN (NITROSTAT) 0.4 MG SL tablet Place 0.4 mg under the tongue every 5 (five) minutes x 3 doses as needed for chest pain.    [provider]  Omega-3 Fatty Acids (FISH OIL PO) Take 1 capsule by mouth daily.    [provider]  pantoprazole (PROTONIX) 40 MG tablet Take 1 tablet (40 mg total) by mouth daily. 04/15/18 05/15/18  Arrien, York Ram, MD  roflumilast (DALIRESP) 500 MCG TABS tablet Take 500 mcg by mouth daily.    [provider]  tetrahydrozoline-zinc (VISINE-AC) 0.05-0.25 % ophthalmic solution Place 2 drops into both eyes 3 (three) times daily as needed (for itching).    [provider]  Thiamine HCl (VITAMIN B-1) 250 MG tablet Take 250 mg by mouth daily.    [provider]  traZODone (DESYREL) 100 MG tablet Take 100 mg by mouth at bedtime.    [provider]  vitamin B-12 (CYANOCOBALAMIN) 500 MCG tablet Take 500 mcg by mouth daily.    [provider]    Family History Family History  Problem Relation Age of Onset  . Sudden Cardiac Death Neg Hx     Social History Social History   Tobacco Use  . Smoking status: Never Smoker  . Smokeless tobacco: Never Used  Substance Use Topics  . Alcohol use: Yes    Comment: 0.5 gallon week  . Drug use: Never     Allergies   Patient has no known allergies.   Review of Systems Review of Systems  Respiratory: Positive for cough, shortness of breath and wheezing.   All other systems reviewed and are negative.    Physical Exam Updated Vital Signs BP (!) 154/95   Pulse (!) 129   Resp (!) 27   Ht 5\' 9"  (1.753 m)   Wt 81 kg   SpO2 100%   BMI 26.37 kg/m   Physical Exam  Constitutional: She is oriented to person, place, and time. She appears well-developed and well-nourished.  HENT:  Head: Normocephalic and atraumatic.  Mouth/Throat: No oropharyngeal exudate.  Eyes: Pupils are equal, round, and  reactive to light. Conjunctivae are normal.  Neck: Normal range of motion. Neck supple. No tracheal deviation present.  Cardiovascular: Intact distal pulses. An irregularly irregular rhythm present. Tachycardia present.  Pulmonary/Chest: Tachypnea noted. She has rales.  Abdominal: Soft. Bowel sounds are normal. She exhibits no mass. There is no tenderness. There is no rebound and no guarding.  Musculoskeletal: Normal range of motion.  Neurological: She is alert and oriented to person, place, and time. She displays normal reflexes.  Skin: Skin is warm and dry. Capillary refill takes less than 2 seconds. She is not diaphoretic.  Psychiatric: She has a normal mood and affect.     ED Treatments / Results  Labs (all labs ordered are  listed, but only abnormal results are displayed) Results for orders placed or performed during the hospital encounter of 04/16/18  CBC with Differential/Platelet  Result Value Ref Range   WBC 9.1 4.0 - 10.5 K/uL   RBC 4.29 3.87 - 5.11 MIL/uL   Hemoglobin 13.2 12.0 - 15.0 g/dL   HCT 16.1 09.6 - 04.5 %   MCV 97.4 78.0 - 100.0 fL   MCH 30.8 26.0 - 34.0 pg   MCHC 31.6 30.0 - 36.0 g/dL   RDW 40.9 (H) 81.1 - 91.4 %   Platelets 426 (H) 150 - 400 K/uL   Neutrophils Relative % 56 %   Neutro Abs 5.1 1.7 - 7.7 K/uL   Lymphocytes Relative 25 %   Lymphs Abs 2.3 0.7 - 4.0 K/uL   Monocytes Relative 14 %   Monocytes Absolute 1.3 (H) 0.1 - 1.0 K/uL   Eosinophils Relative 3 %   Eosinophils Absolute 0.3 0.0 - 0.7 K/uL   Basophils Relative 1 %   Basophils Absolute 0.1 0.0 - 0.1 K/uL   Immature Granulocytes 1 %   Abs Immature Granulocytes 0.1 0.0 - 0.1 K/uL  Brain natriuretic peptide  Result Value Ref Range   B Natriuretic Peptide 371.0 (H) 0.0 - 100.0 pg/mL  I-Stat Chem 8, ED  Result Value Ref Range   Sodium 135 135 - 145 mmol/L   Potassium 4.1 3.5 - 5.1 mmol/L   Chloride 97 (L) 98 - 111 mmol/L   BUN 8 6 - 20 mg/dL   Creatinine, Ser 7.82 0.44 - 1.00 mg/dL    Glucose, Bld 85 70 - 99 mg/dL   Calcium, Ion 9.56 (L) 1.15 - 1.40 mmol/L   TCO2 27 22 - 32 mmol/L   Hemoglobin 13.9 12.0 - 15.0 g/dL   HCT 21.3 08.6 - 57.8 %  I-stat troponin, ED  Result Value Ref Range   Troponin i, poc 0.00 0.00 - 0.08 ng/mL   Comment 3           Dg Chest 2 View  Result Date: 04/03/2018 CLINICAL DATA:  Fatigue and atrial fibrillation. Shortness of breath. EXAM: CHEST - 2 VIEW COMPARISON:  None. FINDINGS: The heart size is borderline. The hila, mediastinum, lungs, and pleura are unremarkable. IMPRESSION: No active cardiopulmonary disease. Electronically Signed   By: Gerome Sam III M.D   On: 04/03/2018 18:02   Dg Ankle 2 Views Right  Result Date: 04/12/2018 CLINICAL DATA:  Right ankle pain history of ORIF hardware removal EXAM: RIGHT ANKLE - 2 VIEW COMPARISON:  None. FINDINGS: No acute displaced fracture or malalignment. Ghost tracks within the distal fibula consistent with prior hardware. Old fracture deformity. Ankle mortise is grossly symmetric. Minimal periosteal reaction of the distal tibia. Diffuse soft tissue swelling. Sclerosis and lucency in the posterior calcaneus, possible bone infarct. Vascular calcifications. IMPRESSION: 1. No acute osseous abnormality. 2. Evidence of prior postsurgical and posttraumatic deformity of the fibula. 3. Minimal periosteal reaction of the distal tibia without definitive fracture deformity 4. 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 Electronically Signed   By: Jasmine Pang M.D.   On: 04/12/2018 15:42   Ct Abdomen Pelvis W Contrast  Result Date: 04/03/2018 CLINICAL DATA:  57 year old female with history of fatigue for the past 3 days. Black tarry stools. Peripheral edema. EXAM: CT ABDOMEN AND PELVIS WITH CONTRAST TECHNIQUE: Multidetector CT imaging of the abdomen and pelvis was performed using the standard protocol following bolus administration of intravenous contrast. CONTRAST:  ISOVUE-300 IOPAMIDOL (ISOVUE-300)  INJECTION  61% COMPARISON:  No priors. FINDINGS: Lower chest: Cardiomegaly. Atherosclerotic calcifications in the descending thoracic aorta, left circumflex and right coronary arteries. Hepatobiliary: Diffuse low attenuation throughout the hepatic parenchyma, indicative of severe hepatic steatosis. No suspicious cystic or solid hepatic lesions. No intra or extrahepatic biliary ductal dilatation. Gallbladder is normal in appearance. Pancreas: No pancreatic mass. No pancreatic ductal dilatation. No pancreatic or peripancreatic fluid or inflammatory changes. Spleen: Unremarkable. Adrenals/Urinary Tract: Bilateral kidneys and bilateral adrenal glands are normal in appearance. No hydroureteronephrosis. Urinary bladder is normal in appearance. Stomach/Bowel: Normal appearance of the stomach. No pathologic dilatation of small bowel or colon. Numerous colonic diverticulae are noted, without surrounding inflammatory changes to suggest an acute diverticulitis at this time. Normal appendix. Vascular/Lymphatic: Aortic atherosclerosis, without evidence of aneurysm or dissection in the abdominal or pelvic vasculature. No lymphadenopathy noted in the abdomen or pelvis. Reproductive: Uterus and ovaries are unremarkable in appearance. Other: No significant volume of ascites.  No pneumoperitoneum. Musculoskeletal: There are no aggressive appearing lytic or blastic lesions noted in the visualized portions of the skeleton. IMPRESSION: 1. No acute findings are noted in the abdomen or pelvis to account for the patient's symptoms. 2. Severe hepatic steatosis. 3. Colonic diverticulosis without evidence of acute diverticulitis at this time. 4. Aortic atherosclerosis, in addition to least 2 vessel coronary artery disease. Please note that although the presence of coronary artery calcium documents the presence of coronary artery disease, the severity of this disease and any potential stenosis cannot be assessed on this non-gated CT  examination. Assessment for potential risk factor modification, dietary therapy or pharmacologic therapy may be warranted, if clinically indicated. 5. Cardiomegaly. Electronically Signed   By: Trudie Reed M.D.   On: 04/03/2018 19:51   Dg Chest Portable 1 View  Result Date: 04/16/2018 CLINICAL DATA:  Shortness of breath. Pain. Recent hospital admission with discharge yesterday. EXAM: PORTABLE CHEST 1 VIEW COMPARISON:  Frontal and lateral views 04/03/2018 FINDINGS: Post median sternotomy and CABG. Unchanged cardiomegaly. Increased interstitial markings in the right greater than left lung. Streaky bibasilar opacities. No pleural effusion or pneumothorax. No confluent airspace disease. IMPRESSION: 1. Increased interstitial markings raise concern for pulmonary edema. Cardiomegaly is unchanged. 2. Bibasilar atelectasis. Electronically Signed   By: Narda Rutherford M.D.   On: 04/16/2018 03:32   Dg Abd Portable 1v  Result Date: 04/05/2018 CLINICAL DATA:  Abdominal pain EXAM: PORTABLE ABDOMEN - 1 VIEW COMPARISON:  CT abdomen and pelvis April 03, 2018 FINDINGS: There is moderate stool in the colon. There is no bowel dilatation or air-fluid level to suggest bowel obstruction. No free air. No abnormal calcifications. IMPRESSION: No evident bowel obstruction or free air on this supine examination. Electronically Signed   By: Bretta Bang III M.D.   On: 04/05/2018 09:52    EKG EKG Interpretation  Date/Time:  Sunday April 16 2018 03:24:09 EDT Ventricular Rate:  111 PR Interval:    QRS Duration: 101 QT Interval:  330 QTC Calculation: 449 R Axis:   59 Text Interpretation:  Atrial fibrillation Borderline ST depression, anterolateral leads Confirmed by Nicanor Alcon, Jasmane Brockway ([id number]) on 04/16/2018 5:19:29 AM   Radiology Dg Chest Portable 1 View  Result Date: 04/16/2018 CLINICAL DATA:  Shortness of breath. Pain. Recent hospital admission with discharge yesterday. EXAM: PORTABLE CHEST 1 VIEW COMPARISON:   Frontal and lateral views 04/03/2018 FINDINGS: Post median sternotomy and CABG. Unchanged cardiomegaly. Increased interstitial markings in the right greater than left lung. Streaky bibasilar opacities. No pleural effusion or pneumothorax. No confluent airspace  disease. IMPRESSION: 1. Increased interstitial markings raise concern for pulmonary edema. Cardiomegaly is unchanged. 2. Bibasilar atelectasis. Electronically Signed   By: Narda RutherfordMelanie  Sanford M.D.   On: 04/16/2018 03:32    Procedures Procedures (including critical care time)  Medications Ordered in ED Medications  albuterol (PROVENTIL) (2.5 MG/3ML) 0.083% nebulizer solution 5 mg (has no administration in time range)  furosemide (LASIX) injection 40 mg (has no administration in time range)  diltiazem (CARDIZEM) 1 mg/mL load via infusion 10 mg (has no administration in time range)    And  diltiazem (CARDIZEM) 100 mg in dextrose 5% 100mL (1 mg/mL) infusion (has no administration in time range)     MDM Reviewed: previous chart, nursing note and vitals Interpretation: labs, ECG and x-ray (negative troponin, pulmonary edema on cxr) Total time providing critical care: 75-105 minutes (bipap diltiazem drip). This excludes time spent performing separately reportable procedures and services. Consults: admitting MD  CRITICAL CARE Performed by: Jasmine AwePALUMBO-RASCH,Yazmin Locher K Total critical care time: 75 minutes Critical care time was exclusive of separately billable procedures and treating other patients. Critical care was necessary to treat or prevent imminent or life-threatening deterioration. Critical care was time spent personally by me on the following activities: development of treatment plan with patient and/or surrogate as well as nursing, discussions with consultants, evaluation of patient's response to treatment, examination of patient, obtaining history from patient or surrogate, ordering and performing treatments and interventions, ordering and  review of laboratory studies, ordering and review of radiographic studies, pulse oximetry and re-evaluation of patient's condition.   Final Clinical Impressions(s) / ED Diagnoses   Final diagnoses:  Acute pulmonary edema [facility](HCC)    Will admit    Katia Hannen, MD 04/16/18 [id number]

## 2018-04-16 NOTE — Progress Notes (Signed)
RT assessed pt. Per MD request to see if bipap was needed at this time. Pt. Is currently in no distress, BBS are clear and diminished. Pt. States she is not sob at this time. RT to monitor. RN is aware.

## 2018-04-16 NOTE — H&P (Addendum)
TRH H&P   Patient Demographics:    Laurie Lynch, is a 57 y.o. female  MRN: [id number]   DOB - [birth year]  Admit Date - 04/16/2018  Outpatient Primary MD for the patient is [facility]  Referring MD/NP/PA:  April Palumbo  Outpatient Specialists:   Patient coming from: home  Chief Complaint  Patient presents with  . Shortness of Breath      HPI:    Laurie Lynch  is a 57 y.o. female, w hypertension, CAD, CHF (Chronic systolic, /diastolic ), mild-mod MR, mod-severe pulmonary hypertension, Copd, OSA (not compliant with cpap), apparently c/o dyspnea since getting home.  Slight cough with yellow sputum.  + palpitations,  Denies fever, chills, cp, n/v, diarrhea, brbpr.  In ED,  T afebrile, P 129 R 27, Bp 138/103 pox 100% on Bipap  CXR  IMPRESSION: 1. Increased interstitial markings raise concern for pulmonary edema. Cardiomegaly is unchanged. 2. Bibasilar atelectasis.  Ekg afib at 110, nl axis, poor R progression  Wbc 9.1, Hgb 13.2, Plt 426 Na 135, K 4.1, Bun 8, Creatinine 0.80  Trop 0.00 BNP371  (prev 466.4)  Pt received lasix 40mg  iv x1 in ED, as well as cardizem gtt  Pt will be admitted for Afib with RVR, Copd exacerbation    Review of systems:    In addition to the HPI above,  No Fever-chills, No Headache, No changes with Vision or hearing, No problems swallowing food or Liquids, No Chest pain,  No Abdominal pain, No Nausea or Vommitting, Bowel movements are regular, No Blood in stool or Urine, No dysuria, No new skin rashes or bruises, No new joints pains-aches,  No new weakness, tingling, numbness in any extremity, No recent weight gain or loss, No polyuria, polydypsia or polyphagia, No significant Mental Stressors.  A full 10 point Review of Systems was done, except as stated above, all other Review of Systems were negative.   With  Past History of the following :    Past Medical History:  Diagnosis Date  . Arthritis   . CHF (congestive heart failure) (HCC)   . COPD (chronic obstructive pulmonary disease) (HCC)   . Coronary artery disease   . Hypertension   . Sleep apnea       Past Surgical History:  Procedure Laterality Date  . ABDOMINAL SURGERY    . 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  She reports CABG at [facility].  No records in Care Everywhere  . CORONARY ARTERY BYPASS GRAFT    . ESOPHAGOGASTRODUODENOSCOPY (EGD) WITH PROPOFOL N/A 04/12/2018   Procedure: ESOPHAGOGASTRODUODENOSCOPY (EGD) WITH PROPOFOL;  Surgeon: Vida Rigger, MD;  Location: [facility] ENDOSCOPY;  Service: Endoscopy;  Laterality: N/A;  . FRACTURE SURGERY        Social History:     Social History  Tobacco Use  . Smoking status: Never Smoker  . Smokeless tobacco: Never Used  Substance Use Topics  . Alcohol use: Yes    Comment: 0.5 gallon week     Lives - at home  Mobility - walks by self   Family History :     Family History  Problem Relation Age of Onset  . Lung cancer Mother   . Bone cancer Father   . Sudden Cardiac Death Neg Hx        Home Medications:   Prior to Admission medications   Medication Sig Start Date End Date Taking? Authorizing Provider  albuterol (PROVENTIL) (2.5 MG/3ML) 0.083% nebulizer solution Take 2.5 mg by nebulization 2 (two) times daily as needed for wheezing or shortness of breath.     [provider]  albuterol (VENTOLIN HFA) 108 (90 Base) MCG/ACT inhaler Inhale 2 puffs into the lungs every 6 (six) hours as needed for wheezing or shortness of breath.    [provider]  apixaban (ELIQUIS) 5 MG TABS tablet Take 5 mg by mouth 2 (two) times daily.    [provider]  atorvastatin (LIPITOR) 40 MG tablet Take 40 mg by mouth at bedtime.    [provider]  busPIRone (BUSPAR) 30 MG  tablet Take 30 mg by mouth 2 (two) times daily.    [provider]  diclofenac sodium (VOLTAREN) 1 % GEL Apply 2 g topically 4 (four) times daily. Apply to right ankle for 7 days. 04/15/18   Arrien, York Ram, MD  diltiazem (CARDIZEM CD) 120 MG 24 hr capsule Take 1 capsule (120 mg total) by mouth daily. 04/15/18 05/15/18  Arrien, York Ram, MD  DULoxetine (CYMBALTA) 30 MG capsule Take 60 mg by mouth daily.    [provider]  folic acid (FOLVITE) 1 MG tablet Take 1 mg by mouth daily.    [provider]  furosemide (LASIX) 40 MG tablet Take 40 mg by mouth daily.    [provider]  gabapentin (NEURONTIN) 300 MG capsule Take 900 mg by mouth 2 (two) times daily.    [provider]  ibuprofen (ADVIL,MOTRIN) 400 MG tablet Take 1 tablet (400 mg total) by mouth every 8 (eight) hours as needed (as needed for right ankle pain.). 04/15/18   Arrien, York Ram, MD  Magnesium Oxide 500 MG TABS Take 500 mg by mouth daily.     [provider]  metoprolol succinate (TOPROL-XL) 100 MG 24 hr tablet Take 1 tablet (100 mg total) by mouth 2 (two) times daily. Take with or immediately following a meal. 04/15/18 05/15/18  Arrien, York Ram, MD  nitroGLYCERIN (NITROSTAT) 0.4 MG SL tablet Place 0.4 mg under the tongue every 5 (five) minutes x 3 doses as needed for chest pain.    [provider]  Omega-3 Fatty Acids (FISH OIL PO) Take 1 capsule by mouth daily.    [provider]  pantoprazole (PROTONIX) 40 MG tablet Take 1 tablet (40 mg total) by mouth daily. 04/15/18 05/15/18  Arrien, York Ram, MD  roflumilast (DALIRESP) 500 MCG TABS tablet Take 500 mcg by mouth daily.    [provider]  tetrahydrozoline-zinc (VISINE-AC) 0.05-0.25 % ophthalmic solution Place 2 drops into both eyes 3 (three) times daily as needed (for itching).    [provider]  Thiamine HCl (VITAMIN B-1) 250 MG tablet Take 250 mg by mouth  daily.    [provider]  traZODone (DESYREL) 100 MG tablet Take 100 mg by  mouth at bedtime.    [provider]  vitamin B-12 (CYANOCOBALAMIN) 500 MCG tablet Take 500 mcg by mouth daily.    [provider]     Allergies:    No Known Allergies   Physical Exam:   Vitals  Blood pressure (!) 138/103, pulse (!) 129, resp. rate 17, height 5\' 9"  (1.753 m), weight 81 kg, SpO2 100 %.   1. General  lying in bed in tachypneic,   2. Normal affect and insight, Not Suicidal or Homicidal, Awake Alert, Oriented X 3.  3. No F.N deficits, ALL C.Nerves Intact, Strength 5/5 all 4 extremities, Sensation intact all 4 extremities, Plantars down going.  4. Ears and Eyes appear Normal, Conjunctivae clear, PERRLA. Moist Oral Mucosa.  5. Supple Neck, No JVD, No cervical lymphadenopathy appriciated, No Carotid Bruits.  6. Symmetrical Chest wall movement, tight + bilateral exp wheezing, no crackles.   7. Irr, irr, s1, s2, 2/6 sem apex  8. Positive Bowel Sounds, Abdomen Soft, No tenderness, No organomegaly appriciated,No rebound -guarding or rigidity.  9.  No Cyanosis, Normal Skin Turgor, No Skin Rash or Bruise.  10. Good muscle tone,  joints appear normal , no effusions, Normal ROM.  11. No Palpable Lymph Nodes in Neck or Axillae     Data Review:    CBC Recent Labs  Lab 04/12/18 0405 04/16/18 0322 04/16/18 0329  WBC 7.9 9.1  --   HGB 13.1 13.2 13.9  HCT 39.7 41.8 41.0  PLT 344 426*  --   MCV 95.7 97.4  --   MCH 31.6 30.8  --   MCHC 33.0 31.6  --   RDW 16.1* 16.1*  --   LYMPHSABS  --  2.3  --   MONOABS  --  1.3*  --   EOSABS  --  0.3  --   BASOSABS  --  0.1  --    ------------------------------------------------------------------------------------------------------------------  Chemistries  Recent Labs  Lab 04/10/18 0545 04/11/18 0717 04/12/18 0405 04/13/18 0506 04/14/18 0637 04/15/18 0537 04/16/18 0329  NA 140 141 140 139 141 140 135  K 3.6  3.2* 3.5 5.2* 3.6 3.9 4.1  CL 97* 98 102 105 102 102 97*  CO2 31 30 29  21* 30 29  --   GLUCOSE 90 95 98 80 89 90 85  BUN 8 7 7 11 11 11 8   CREATININE 0.77 0.75 0.82 0.75 0.83 0.74 0.80  CALCIUM 8.8* 8.6* 8.9 8.6* 8.7* 8.8*  --   MG 1.8 1.7 1.7 1.9  --   --   --    ------------------------------------------------------------------------------------------------------------------ estimated creatinine clearance is 87.2 mL/min (by C-G formula based on SCr of 0.8 mg/dL). ------------------------------------------------------------------------------------------------------------------ No results for input(s): TSH, T4TOTAL, T3FREE, THYROIDAB in the last 72 hours.  Invalid input(s): FREET3  Coagulation profile Recent Labs  Lab 04/14/18 0637  INR 1.22   ------------------------------------------------------------------------------------------------------------------- No results for input(s): DDIMER in the last 72 hours. -------------------------------------------------------------------------------------------------------------------  Cardiac Enzymes No results for input(s): CKMB, TROPONINI, MYOGLOBIN in the last 168 hours.  Invalid input(s): CK ------------------------------------------------------------------------------------------------------------------    Component Value Date/Time   BNP 371.0 (H) 04/16/2018 0322     ---------------------------------------------------------------------------------------------------------------  Urinalysis    Component Value Date/Time   COLORURINE YELLOW 04/03/2018 2145   APPEARANCEUR CLEAR 04/03/2018 2145   LABSPEC 1.033 (H) 04/03/2018 2145   PHURINE 6.0 04/03/2018 2145   GLUCOSEU NEGATIVE 04/03/2018 2145   HGBUR NEGATIVE 04/03/2018 2145   BILIRUBINUR NEGATIVE 04/03/2018 2145   KETONESUR 20 (A) 04/03/2018 2145  PROTEINUR NEGATIVE 04/03/2018 2145   NITRITE NEGATIVE 04/03/2018 2145   LEUKOCYTESUR NEGATIVE 04/03/2018 2145     ----------------------------------------------------------------------------------------------------------------   Imaging Results:    Dg Chest Portable 1 View  Result Date: 04/16/2018 CLINICAL DATA:  Shortness of breath. Pain. Recent hospital admission with discharge yesterday. EXAM: PORTABLE CHEST 1 VIEW COMPARISON:  Frontal and lateral views 04/03/2018 FINDINGS: Post median sternotomy and CABG. Unchanged cardiomegaly. Increased interstitial markings in the right greater than left lung. Streaky bibasilar opacities. No pleural effusion or pneumothorax. No confluent airspace disease. IMPRESSION: 1. Increased interstitial markings raise concern for pulmonary edema. Cardiomegaly is unchanged. 2. Bibasilar atelectasis. Electronically Signed   By: Narda RutherfordMelanie  Sanford M.D.   On: 04/16/2018 03:32       Assessment & Plan:    Principal Problem:   Atrial fibrillation with RVR (HCC) Active Problems:   COPD with acute exacerbation (HCC)    Afib with RVR Tele Trop I q6hx3 Hold off on cardiac echo, just had on 04/14/2018, defer to cardiology regarding if needs another Cont Toprol XL 100mg  po bid Start cardizem GTT Cont eliquis Cardiology consult by email sent, appreciate input  Copd exacerbation Solumedrol 80mg  iv q8h zithromax 500mg  iv qday Xopenex q6h and q6h prn spiriva 1puff qday Cont Daliresp  CHF (chronic systolic/ diastolic) Cont Toprol XL as above Cont Furosemide 40mg  po qday  Anxiety Cont Buspar 30mg  po bid Cont Cymbalta 60mg  po qday Cont Trazodone 100mg  po qhs  ETOH ABUSE/ DEPENDENCE CIWA   DVT Prophylaxis Eliquis,  SCDs  AM Labs Ordered, also please review Full Orders  Family Communication: Admission, patients condition and plan of care including tests being ordered have been discussed with the patient  who indicate understanding and agree with the plan and Code Status.  Code Status  FULL CODE  Likely DC to  home  Condition critical  Consults called:  cardiology consult by email  Admission status: observation,  Pt will require hospitalization for afib with RVR as well as CHF and Copd exacerbation.  Pt may turn around quickly but if not may require inpatient admission >2 nites  Time spent in minutes : 60 minutes critical care   Pearson GrippeJames Wilhemenia Camba M.D on 04/16/2018 at 5:48 AM  Between 7am to 7pm - Pager - [phone]   After 7pm go to [email or web address] - password [email or web address]  [facility] - Office  [phone]

## 2018-04-16 NOTE — Consult Note (Addendum)
Cardiology Consultation:   Patient ID: Abilene Mcphee MRN: [id number]; DOB: 08-Feb-1961  Admit date: 04/16/2018 Date of Consult: 04/16/2018  Primary Care Provider: [facility] Primary Cardiologist: Rollene Rotunda, MD  Primary Electrophysiologist:  None   Patient Profile:   Mikya Don is a 57 y.o. female with a hx of  CAD and CABG 2005, AVR in 2005, pulmonary HTN chronic a fib on eliquis, noncompliance, alcoholism, COPD on home 02 and recent admit/discharge 2 days ago with A fib and DCCV who is being seen today for the evaluation of a fib and dyspnea at the request of Dr. Sharon Seller.  History of Present Illness:   Ms. Yochim with hx as above and hx of a fib from 2014 at times with RVR.  On last visit initially  no plan for DCCV medication only  But with difficult to control rate with ambulation and significant fatigue pt was scheduled for TEE/DCCV which she underwent 04/14/18 - successful to SR.  She was on eliquis.    With last hospitalization she had dark stool and had GI eval with EGD with no bleeding.  Also with chronic systolic and diastolic HF, HTN and ETOH abuse.    At discharge was on BB same dose as admit and dilt which had been 360 mg reduced to 240 mg prior to last admit and at d/c recently reduced to 120 mg.     Last cath 2014 with Coronary angiography showed mild-to-moderate 25% distal LMCA disease,  chronically occluded native LAD with patent LIMA-to-distal LAD.The Ramus Intermediate has moderate nonobstructive disease as does the mid-LCx.There was a dominant RCA with mild ISR in the mid RCA.Left ventriculogram showed  LVEF=30% with global hypokinesis and 3+ mitral regurgitation.  Hx of AVR per hx.   ECHO 09/2017  Interpretation Summary A complete portable two-dimensional transthoracic echocardiogram with color flow Doppler and Spectral Doppler was performed. The left ventricle is grossly normal size. There is normal left ventricular wall thickness. The left  ventricular ejection fraction is mildly reduced (45-50%). Septal motion is consistent with conduction abnormality. The aortic valve is trileaflet. Unable to adequately determine diastolic dysfunction. The mitral valve leaflets appear thickened, but open well. There is mild-moderate (1-2+) mitral regurgitation. There is moderate (2+) tricuspid regurgitation. Right ventricular systolic pressure is elevated between 50-4mm Hg, consistent with moderately severe pulmonary hypertension. The left atrium is moderately dilated. The right atrium is mildly dilated. Left Ventricle The left ventricle is grossly normal size. There is normal left ventricular wall thickness. The left ventricular ejection fraction is mildly reduced (45-50%). Septal motion is consistent with conduction abnormality. Unable to adequately determine diastolic dysfunction.   Mitral Valve leaflets appear thickened, but open well. There is no mitral valve stenosis. There is mild-moderate (1-2+) mitral regurgitation. Tricuspid Valve The tricuspid valve is grossly normal. There is moderate (2+) tricuspid regurgitation.Right ventricular systolic pressure is elevated between 50- 60mm Hg, consistent with moderately severe pulmonary hypertension. Aortic Valve The aortic valve is trileaflet. The aortic valve opens well. There is no aortic stenosis. There is no aortic regurgitation present.  TEE:  04/14/18  Findings: Please see echo section for full report.  Normal LV size with mild LV hypertrophy.  EF 55-60%, normal wall motion.  Normal RV size and systolic function.  Trivial TR, peak RV-RA gradient 20 mmHg. Trivial mitral regurgitation. Mildly calcified, trileaflet aortic valve with no stenosis or regurgitation. Moderate-severe left atrial enlargement, no LA appendage thrombus.  Moderate right atrial enlargement.  No PFO/ASD by color doppler.  Normal  caliber aorta with mild plaque.  Impression: No LA thrombus, may proceed to DCCV.  which  was successful.  Now readmitted 04/16/18 with SOB.  Presented to ER by EMS with complaints of dyspnea since discharge.  The dyspnea increased.  She did have one brief episode of chest pain mid sternal.  She was placed on BiPAP - now stopped.   EKG  afib with rate of 111, borderline ST depression in ant leads.  I personally reviewed and ST depression in ant leads when in SR.   Telemetry:  Telemetry was personally reviewed and demonstrates:  A fib with RVR at times.   Na 135 k+ 4.1 Cr 0.80 BNP 371 poc troponin 0.00 hgb 13.9, hct 41  CXR concerning for pulmonary edema    Currently no chest pain, breathing stable and able to lie almost flat. She stated she did have ETOH yesterday AM but is somewhat confused on timeline.  She takes her eliquis consistently and her metoprolol.     Past Medical History:  Diagnosis Date  . Arthritis   . CHF (congestive heart failure) (HCC)   . COPD (chronic obstructive pulmonary disease) (HCC)   . Coronary artery disease   . Hypertension   . Sleep apnea     Past Surgical History:  Procedure Laterality Date  . ABDOMINAL SURGERY    . 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  She reports CABG at [facility].  No records in Care Everywhere  . CORONARY ARTERY BYPASS GRAFT    . ESOPHAGOGASTRODUODENOSCOPY (EGD) WITH PROPOFOL N/A 04/12/2018   Procedure: ESOPHAGOGASTRODUODENOSCOPY (EGD) WITH PROPOFOL;  Surgeon: Vida RiggerMagod, Marc, MD;  Location[facility];  Service: Endoscopy;  Laterality: N/A;  . FRACTURE SURGERY       Home Medications:  Prior to Admission medications   Medication Sig Start Date End Date Taking? Authorizing Provider  albuterol (PROVENTIL) (2.5 MG/3ML) 0.083% nebulizer solution Take 2.5 mg by nebulization 2 (two) times daily as needed for wheezing or shortness of breath.     [provider]  albuterol (VENTOLIN HFA) 108 (90 Base) MCG/ACT inhaler Inhale 2  puffs into the lungs every 6 (six) hours as needed for wheezing or shortness of breath.    [provider]  apixaban (ELIQUIS) 5 MG TABS tablet Take 5 mg by mouth 2 (two) times daily.    [provider]  atorvastatin (LIPITOR) 40 MG tablet Take 40 mg by mouth at bedtime.    [provider]  busPIRone (BUSPAR) 30 MG tablet Take 30 mg by mouth 2 (two) times daily.    [provider]  diclofenac sodium (VOLTAREN) 1 % GEL Apply 2 g topically 4 (four) times daily. Apply to right ankle for 7 days. 04/15/18   Arrien, York RamMauricio Daniel, MD  diltiazem (CARDIZEM CD) 120 MG 24 hr capsule Take 1 capsule (120 mg total) by mouth daily. 04/15/18 05/15/18  Arrien, York RamMauricio Daniel, MD  DULoxetine (CYMBALTA) 30 MG capsule Take 60 mg by mouth daily.    [provider]  folic acid (FOLVITE) 1 MG tablet Take 1 mg by mouth daily.    [provider]  furosemide (LASIX) 40 MG tablet Take 40 mg by mouth daily.    [provider]  gabapentin (NEURONTIN) 300 MG capsule Take 900 mg by mouth 2 (two) times daily.    [provider]  ibuprofen (ADVIL,MOTRIN) 400 MG tablet Take 1 tablet (400  mg total) by mouth every 8 (eight) hours as needed (as needed for right ankle pain.). 04/15/18   Arrien, York Ram, MD  Magnesium Oxide 500 MG TABS Take 500 mg by mouth daily.     [provider]  metoprolol succinate (TOPROL-XL) 100 MG 24 hr tablet Take 1 tablet (100 mg total) by mouth 2 (two) times daily. Take with or immediately following a meal. 04/15/18 05/15/18  Arrien, York Ram, MD  nitroGLYCERIN (NITROSTAT) 0.4 MG SL tablet Place 0.4 mg under the tongue every 5 (five) minutes x 3 doses as needed for chest pain.    [provider]  Omega-3 Fatty Acids (FISH OIL PO) Take 1 capsule by mouth daily.    [provider]  pantoprazole (PROTONIX) 40 MG tablet Take 1 tablet (40 mg total) by mouth daily. 04/15/18 05/15/18  Arrien, York Ram, MD  roflumilast (DALIRESP) 500 MCG TABS tablet Take 500 mcg by mouth daily.    [provider]  tetrahydrozoline-zinc (VISINE-AC) 0.05-0.25 % ophthalmic solution Place 2 drops into both eyes 3 (three) times daily as needed (for itching).    [provider]  Thiamine HCl (VITAMIN B-1) 250 MG tablet Take 250 mg by mouth daily.    [provider]  traZODone (DESYREL) 100 MG tablet Take 100 mg by mouth at bedtime.    [provider]  vitamin B-12 (CYANOCOBALAMIN) 500 MCG tablet Take 500 mcg by mouth daily.    [provider]    Inpatient Medications: Scheduled Meds: . albuterol  5 mg Nebulization Once  . apixaban  5 mg Oral BID  . atorvastatin  40 mg Oral QHS  . busPIRone  30 mg Oral BID  . DULoxetine  60 mg Oral Daily  . folic acid  1 mg Oral Daily  . furosemide  40 mg Oral Daily  . levalbuterol  1.25 mg Nebulization Q6H  . magnesium oxide  400 mg Oral Daily  . methylPREDNISolone (SOLU-MEDROL) injection  80 mg Intravenous Q8H  . metoprolol succinate  100 mg Oral BID  . pantoprazole  40 mg Oral Daily  . roflumilast  500 mcg Oral Daily  . sodium chloride flush  3 mL Intravenous Q12H  . thiamine injection  100 mg Intravenous Daily  . tiotropium  18 mcg Inhalation Daily  . traZODone  100 mg Oral QHS  . vitamin B-12  500 mcg Oral Daily   Continuous Infusions: . sodium chloride    . azithromycin    . diltiazem (CARDIZEM) infusion 10 mg/hr (04/16/18 0809)   PRN Meds: sodium chloride, acetaminophen **OR** acetaminophen, levalbuterol, LORazepam, naphazoline-glycerin, sodium chloride flush  Allergies:   No Known Allergies  Social History:   Social History   Socioeconomic History  . Marital status: Single    Spouse name: Not on file  . Number of children: Not on file  . Years of education: Not on file  . Highest education level: Not on file  Occupational History  . Not on file  Social Needs  . Financial resource strain: Not on  file  . Food insecurity:    Worry: Not on file    Inability: Not on file  . Transportation needs:    Medical: Not on file    Non-medical: Not on file  Tobacco Use  . Smoking status: Never Smoker  . Smokeless tobacco: Never Used  Substance and Sexual Activity  . Alcohol use: Yes    Comment: 0.5 gallon week  . Drug use: Never  .  Sexual activity: Not Currently  Lifestyle  . Physical activity:    Days per week: Not on file    Minutes per session: Not on file  . Stress: Not on file  Relationships  . Social connections:    Talks on phone: Not on file    Gets together: Not on file    Attends religious service: Not on file    Active member of club or organization: Not on file    Attends meetings of clubs or organizations: Not on file    Relationship status: Not on file  . Intimate partner violence:    Fear of current or ex partner: Not on file    Emotionally abused: Not on file    Physically abused: Not on file    Forced sexual activity: Not on file  Other Topics Concern  . Not on file  Social History Narrative  . Not on file    Family History:    Family History  Problem Relation Age of Onset  . Lung cancer Mother   . Bone cancer Father   . Sudden Cardiac Death Neg Hx      ROS:  Please see the history of present illness.  General:no colds or fevers, no weight changes Skin:no rashes or ulcers HEENT:no blurred vision, no congestion CV:see HPI PUL:see HPI--hx sleep apnea does now wear CPAP GI:no diarrhea constipation or melena, no indigestion GU:no hematuria, no dysuria MS:no joint pain, no claudication Neuro:no syncope, no lightheadedness Endo:no diabetes, no thyroid disease  All other ROS reviewed and negative.     Physical Exam/Data:   Vitals:   04/16/18 0530 04/16/18 0627 04/16/18 0633 04/16/18 0727  BP: (!) 138/103  134/73 129/78  Pulse:   92 (!) 116  Resp: 17  20 20   Temp:   98.6 F (37 C) 98 F (36.7 C)  TempSrc:   Axillary Oral  SpO2:   100% 100%    Weight:  82 kg    Height:       No intake or output data in the 24 hours ending 04/16/18 0841 Filed Weights   04/16/18 0319 04/16/18 0627  Weight: 81 kg 82 kg   Body mass index is 26.7 kg/m.  General:  Well nourished, female in no acute distress but restless mildly agitated  HEENT: normal Lymph: no adenopathy Neck: no JVD Endocrine:  No thryomegaly Vascular: No carotid bruits; pedal pulses 2+ bilaterally   Cardiac:  irreg irreg no murmur, gallup rub or click  Lungs:  clear to auscultation bilaterally, no wheezing, rhonchi or rales  Abd: soft, nontender, no hepatomegaly  Ext: no edema Musculoskeletal:  No deformities, BUE and BLE strength normal and equal Skin: warm and dry  Neuro: alert and oriented X 3 , poor memory, MAE, follows commands  no focal abnormalities noted Psych:  Normal affect      Relevant CV Studies: See above.    Laboratory Data:  Chemistry Recent Labs  Lab 04/13/18 0506 04/14/18 0637 04/15/18 0537 04/16/18 0329  NA 139 141 140 135  K 5.2* 3.6 3.9 4.1  CL 105 102 102 97*  CO2 21* 30 29  --   GLUCOSE 80 89 90 85  BUN 11 11 11 8   CREATININE 0.75 0.83 0.74 0.80  CALCIUM 8.6* 8.7* 8.8*  --   GFRNONAA >60 >60 >60  --   GFRAA >60 >60 >60  --   ANIONGAP 13 9 9   --     No results for input(s): PROT, ALBUMIN,  AST, ALT, ALKPHOS, BILITOT in the last 168 hours. Hematology Recent Labs  Lab 04/12/18 0405 04/16/18 0322 04/16/18 0329  WBC 7.9 9.1  --   RBC 4.15 4.29  --   HGB 13.1 13.2 13.9  HCT 39.7 41.8 41.0  MCV 95.7 97.4  --   MCH 31.6 30.8  --   MCHC 33.0 31.6  --   RDW 16.1* 16.1*  --   PLT 344 426*  --    Cardiac EnzymesNo results for input(s): TROPONINI in the last 168 hours.  Recent Labs  Lab 04/16/18 0327  TROPIPOC 0.00    BNP Recent Labs  Lab 04/16/18 0322  BNP 371.0*    DDimer No results for input(s): DDIMER in the last 168 hours.  Radiology/Studies:  Dg Ankle 2 Views Right  Result Date: 04/12/2018 CLINICAL DATA:   Right ankle pain history of ORIF hardware removal EXAM: RIGHT ANKLE - 2 VIEW COMPARISON:  None. FINDINGS: No acute displaced fracture or malalignment. Ghost tracks within the distal fibula consistent with prior hardware. Old fracture deformity. Ankle mortise is grossly symmetric. Minimal periosteal reaction of the distal tibia. Diffuse soft tissue swelling. Sclerosis and lucency in the posterior calcaneus, possible bone infarct. Vascular calcifications. IMPRESSION: 1. No acute osseous abnormality. 2. Evidence of prior postsurgical and posttraumatic deformity of the fibula. 3. Minimal periosteal reaction of the distal tibia without definitive fracture deformity 4. 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 Electronically Signed   By: Jasmine Pang M.D.   On: 04/12/2018 15:42   Dg Chest Portable 1 View  Result Date: 04/16/2018 CLINICAL DATA:  Shortness of breath. Pain. Recent hospital admission with discharge yesterday. EXAM: PORTABLE CHEST 1 VIEW COMPARISON:  Frontal and lateral views 04/03/2018 FINDINGS: Post median sternotomy and CABG. Unchanged cardiomegaly. Increased interstitial markings in the right greater than left lung. Streaky bibasilar opacities. No pleural effusion or pneumothorax. No confluent airspace disease. IMPRESSION: 1. Increased interstitial markings raise concern for pulmonary edema. Cardiomegaly is unchanged. 2. Bibasilar atelectasis. Electronically Signed   By: Narda Rutherford M.D.   On: 04/16/2018 03:32    Assessment and Plan:   1. A fib with RVR at times on dilt drip.  S/p DCCV 04/14/18 after TEE.  Pt with A fib since 2014 best I can determine.  We attempted DCCV with high rates that were not controlled.  From care everywhere I see note that she has been difficult to control rate at times.  She does not seem reliable for antiarrythmic in addition to ETOH intake and elevated AST. Not sure further DCCV will be helpful without antiarrythmic.  She does have moderate-severe LA  enlargement.  Dr. Diona Browner to see.   She is on IV dilt currently.  And metoprolol XL 100 BID her dilt has been decreased twice, from 360 due to hypotension to 240 and now 120 mg     BP elevated on admit 154/95  She would be better with higher dose of dilt.  ? EP consult   Difficult to say, HR driving respiratory status vs the opposite.  2. Acute pulmonary edema with 40 mg IV lasix, no I&O  But improved respiratory status.  Now lasix 40 po daily.  Along with nebs. 3. COPD exacerbation on nebs and steroids followed by PCP, she sees pulmonology  at [facility].  On home 02. 4. CAD with CABG in 2005 and last cath 2014 history notes AVR assumed done at time of CABG I cannot find OR note.  Troponin neg 5. Alcohol  use she continues to use.  6. HTN though at times is low with decrease of dilt.   7. Recent EGD without acute bleeding 8. HLD on statin. 9. Elevated LFT will recheck and with low Mg+ on last visit will recheck.    For questions or updates, please contact [facility] Please consult [email or web address] for contact info under    Signed, Nada Boozer, NP  04/16/2018 8:41 AM   Attending note:  Patient seen and examined.  I reviewed her recent records and evaluation and also discussed the case with Ms. Ingle NP.  Patient was just hospitalized last week with acute on chronic combined heart failure in the setting of hypertension, alcohol abuse, COPD, and essentially permanent atrial fibrillation with difficult to control heart rates.  She was seen by our cardiology service and underwent a TEE guided cardioversion on September 13 with successful restoration of sinus rhythm.  Cardizem dose was cut back at that time and she was discharged ultimately on September 14.  She now re-presents reporting a feeling of palpitations and shortness of breath.  States that she did have a "shot" of alcohol yesterday.  She is noted to be in rapid atrial fibrillation.  On examination she is in no active distress but does  have atrial fibrillation with RVR by telemetry which I personally reviewed, heart rates around 120 bpm.  Systolic blood pressure 120-130.  Lungs exhibit coarse breath sounds no active wheezing.  Cardiac exam reveals irregularly irregular rhythm with soft systolic murmur no gallop.  Lab work shows potassium 4.1, creatinine 0.8, troponin I less than 0.03, hemoglobin 13.9. Chest x-ray shows interstitial edema.  I personally reviewed her tracing from today which shows rapid atrial fibrillation with nonspecific ST-T changes.  Patient presents with recurrent atrial fibrillation with RVR and has a baseline history of what is essentially permanent atrial fibrillation.  The likelihood of her maintaining sinus rhythm is quite low in the setting of COPD, alcohol abuse, and moderate to severe left atrial enlargement by TEE.  Also not clear that she would be a good candidate for antiarrhythmic therapy, amiodarone would be suboptimal with her lung disease, and Tikosyn would also be suboptimal with propensity to electrolyte abnormalities in the setting of alcohol abuse.  Would favor heart rate control strategy with combination of calcium channel blocker and beta-blocker, with continuation of Eliquis for stroke prophylaxis.  Continue Toprol-XL 100 mg twice daily, increase IV diltiazem to 15 mg/h.  Conversion can then be made back to higher dose Cardizem CD.  Jonelle Sidle, M.D., F.A.C.C.

## 2018-04-16 NOTE — ED Notes (Signed)
Admitting provider at bedside.

## 2018-04-16 NOTE — Progress Notes (Signed)
[facility] [address] [facility] [address] TEAM 1 - Stepdown/ICU TEAM  Laurie Lynch  ZOX:[id number] DOB: 11/14/1960 DOA: 04/16/2018 [facility]    Brief Narrative:  57 y.o. female w/ a hx of chronic Afib on eliquis, HTN, CAD s/p CABG 2005, AVR in 2005, Chronic systolic and diastolic CHF, mild-mod MR, mod-severe pulmonary hypertension, COPD, OSA (not compliant with cpap), and alcoholism who returned to the ED c/o dyspnea. She was found to be back in Afib w/ RVR.  [facility] tx for Afib w/ RVR w/ DCCV, as well as an evaluation for melena (negative GI eval), this falling closely behind a preceding admission [facility] w/ AKI and EtOH withdrawal.      Subjective: Pt is seen for a f/u visit.    Assessment & Plan:  Recurrent Chronic Afib w/ RVR  Severe hypomagnesemia   COPD  Chronic systolic and diastolic CHF   EtOH abuse   Depression with anxiety Continue Cymbalta, Buspar, and trazodone   DVT prophylaxis: eliquis Code Status: FULL CODE Family Communication:  Disposition Plan:   Consultants:  [facility]   Antimicrobials:  none   Objective: Blood pressure 124/63, pulse (!) 104, temperature 98.8 F (37.1 C), temperature source Oral, resp. rate (!) 26, height 5\' 9"  (1.753 m), weight 82 kg, SpO2 95 %.  Intake/Output Summary (Last 24 hours) at 04/16/2018 1604 Last data filed at 04/16/2018 1300 Gross per 24 hour  Intake 360 ml  Output 600 ml  Net -240 ml   Filed Weights   04/16/18 0319 04/16/18 0627  Weight: 81 kg 82 kg    Examination: Pt was seen for a f/u visit.    CBC: Recent Labs  Lab 04/12/18 0405 04/16/18 0322 04/16/18 0329  WBC 7.9 9.1  --   NEUTROABS  --  5.1  --   HGB 13.1 13.2 13.9  HCT 39.7 41.8 41.0  MCV 95.7 97.4  --   PLT 344 426*  --    Basic Metabolic Panel: Recent Labs  Lab 04/10/18 0545 04/11/18 0717 04/12/18 0405 04/13/18 0506 04/14/18 0637 04/15/18 0537 04/16/18 0329 04/16/18 0830  NA 140 141 140 139 141 140 135   --   K 3.6 3.2* 3.5 5.2* 3.6 3.9 4.1  --   CL 97* 98 102 105 102 102 97*  --   CO2 31 30 29  21* 30 29  --   --   GLUCOSE 90 95 98 80 89 90 85  --   BUN 8 7 7 11 11 11 8   --   CREATININE 0.77 0.75 0.82 0.75 0.83 0.74 0.80  --   CALCIUM 8.8* 8.6* 8.9 8.6* 8.7* 8.8*  --   --   MG 1.8 1.7 1.7 1.9  --   --   --  1.2*   GFR: Estimated Creatinine Clearance: 87.7 mL/min (by C-G formula based on SCr of 0.8 mg/dL).  Liver Function Tests: Recent Labs  Lab 04/16/18 0830  AST 58*  ALT 25  ALKPHOS 106  BILITOT 1.0  PROT 6.5  ALBUMIN 2.8*    Coagulation Profile: Recent Labs  Lab 04/14/18 0637  INR 1.22    Cardiac Enzymes: Recent Labs  Lab 04/16/18 0830  TROPONINI <0.03    HbA1C: Hgb A1c MFr Bld  Date/Time Value Ref Range Status  04/03/2018 11:49 PM 5.2 4.8 - 5.6 % Final    Comment:    (NOTE) Pre diabetes:          5.7%-6.4% Diabetes:              >  6.4% Glycemic control for   <7.0% adults with diabetes     CBG: Recent Labs  Lab 04/14/18 2024 04/15/18 0051 04/15/18 0507 04/15/18 0800 04/15/18 1153  GLUCAP 113* 101* 88 86 99     Scheduled Meds: . albuterol  5 mg Nebulization Once  . apixaban  5 mg Oral BID  . atorvastatin  40 mg Oral QHS  . busPIRone  30 mg Oral BID  . DULoxetine  60 mg Oral Daily  . folic acid  1 mg Oral Daily  . furosemide  40 mg Oral Daily  . levalbuterol  1.25 mg Nebulization TID  . methylPREDNISolone (SOLU-MEDROL) injection  80 mg Intravenous Q8H  . metoprolol succinate  100 mg Oral BID  . pantoprazole  40 mg Oral Daily  . roflumilast  500 mcg Oral Daily  . sodium chloride flush  3 mL Intravenous Q12H  . thiamine  100 mg Oral Daily  . tiotropium  18 mcg Inhalation Daily  . traZODone  100 mg Oral QHS  . vitamin B-12  500 mcg Oral Daily     LOS: 0 days   Time spent: No Charge  Lonia Blood, MD [facility] Office  [phone] Pager - Text Page per Amion as per below:  On-Call/Text Page:      [email or web address]  If  7PM-7AM, please contact night-coverage [email or web address] 04/16/2018, 4:04 PM

## 2018-04-16 NOTE — Progress Notes (Signed)
Pt refusing to keep Bipap mask on. Pt denies any SOB. 02 sat 96%on 2L.Will cont to monitor pt.

## 2018-04-17 LAB — COMPREHENSIVE METABOLIC PANEL
ALK PHOS: 108 U/L (ref 38–126)
ALT: 24 U/L (ref 0–44)
AST: 43 U/L — AB (ref 15–41)
Albumin: 2.8 g/dL — ABNORMAL LOW (ref 3.5–5.0)
Anion gap: 11 (ref 5–15)
BUN: 6 mg/dL (ref 6–20)
CALCIUM: 8.6 mg/dL — AB (ref 8.9–10.3)
CO2: 26 mmol/L (ref 22–32)
CREATININE: 0.7 mg/dL (ref 0.44–1.00)
Chloride: 100 mmol/L (ref 98–111)
GFR calc Af Amer: >60 mL/min (ref >60)
Glucose, Bld: 204 mg/dL — ABNORMAL HIGH (ref 70–99)
Potassium: 3.1 mmol/L — ABNORMAL LOW (ref 3.5–5.1)
Sodium: 137 mmol/L (ref 135–145)
Total Bilirubin: 0.6 mg/dL (ref 0.3–1.2)
Total Protein: 6.7 g/dL (ref 6.5–8.1)

## 2018-04-17 LAB — CBC
HCT: 37.2 % (ref 36.0–46.0)
Hemoglobin: 12.2 g/dL (ref 12.0–15.0)
MCH: 30.6 pg (ref 26.0–34.0)
MCHC: 32.8 g/dL (ref 30.0–36.0)
MCV: 93.2 fL (ref 78.0–100.0)
PLATELETS: 515 10*3/uL — AB (ref 150–400)
RBC: 3.99 MIL/uL (ref 3.87–5.11)
RDW: 15.9 % — AB (ref 11.5–15.5)
WBC: 6.1 10*3/uL (ref 4.0–10.5)

## 2018-04-17 LAB — MAGNESIUM: MAGNESIUM: 2.2 mg/dL (ref 1.7–2.4)

## 2018-04-17 NOTE — Discharge Instructions (Signed)

## 2018-04-17 NOTE — Discharge Summary (Signed)
   PT LEFT AMA SUMMARY  Laurie Lynch MRN - [id number] DOB - 01/26/1961  Date of Admission - 04/16/2018 Date LEFT AMA: 04/17/2018  Attending Physician:  Laurie Lynch,Laurie Lynch  Patient's PCP:  [facility]  Disposition: LEFT AMA  Follow-up Appts:  Not able to be arranged or discussed as pt LEFT AMA  Diagnoses at time pt LEFT AMA: Recurrent Chronic Afib w/ RVR Severe hypomagnesemia  COPD Chronic systolic and diastolic CHF  EtOH abuse  Depression with anxiety  Initial presentation: 57 y.o.female w/ a hx of chronic Afib on eliquis, HTN, CAD s/p CABG 2005, AVR in 2005, Chronic systolic and diastolic CHF, mild-mod MR, mod-severe pulmonary hypertension, COPD, OSA (not compliant with cpap), and alcoholism who returned to the ED c/o dyspnea. She was found to be back in Afib w/ RVR.  [facility] after tx for Afib w/ RVR w/ DCCV, as well as an evaluation for melena (negative GI eval), this falling closely behind [facility] w/ AKI and EtOH withdrawal.      Hospital Course: Listed below are the active problems present, and the status of the care of these problems, at the time the pt decided to LEAVE AMA:  Recurrent Chronic Afib w/ RVR Severe hypomagnesemia  COPD Chronic systolic and diastolic CHF  EtOH abuse  Depression with anxiety  On 04/17/18 the RN entered the room to find the patient removing her gown.  She was insistent on leaving the hospital.  Her son was at her side, and agreed with her going home, stating "you can'Lynch keep her here against her will." The staff attempted to convince the pt and her some to remain in the hospital for care, but they both refused.  She refused to sign the paperwork, but left AMA.  I was unable to see the patient before her departure as I was occupied by other acutely ill patients.      Medication List    Unable to be finalized as pt LEFT AMA  Day of Discharge Wt Readings from Last 3 Encounters:    04/17/18 80.3 kg  04/15/18 80.4 kg   Temp Readings from Last 3 Encounters:  04/17/18 97.8 F (36.6 C) (Oral)  04/15/18 98.1 F (36.7 C) (Oral)   BP Readings from Last 3 Encounters:  04/17/18 126/78  04/15/18 (!) 140/106   Pulse Readings from Last 3 Encounters:  04/17/18 87  04/15/18 80    Physical Exam: Exam not able to be completed at time of d/c as pt LEFT AMA  2:10 PM 04/17/18  Laurie BloodJeffrey Lynch. Aliveah Gallant, MD [facility] Office  [phone] Pager [phone]  On-Call/Text Page:      [email or web address]      password [email or web address]

## 2018-04-17 NOTE — Anesthesia Postprocedure Evaluation (Signed)
Anesthesia Post Note  Patient: Laurie Lynch  Procedure(s) Performed: CARDIOVERSION (N/A ) TRANSESOPHAGEAL ECHOCARDIOGRAM (TEE) (N/A )     Patient location during evaluation: PACU Anesthesia Type: General Level of consciousness: awake and alert Pain management: pain level controlled Vital Signs Assessment: post-procedure vital signs reviewed and stable Respiratory status: spontaneous breathing, nonlabored ventilation and respiratory function stable Cardiovascular status: blood pressure returned to baseline and stable Postop Assessment: no apparent nausea or vomiting Anesthetic complications: no    Last Vitals:  Vitals:   04/15/18 0506 04/15/18 0939  BP: 118/62 (!) 140/106  Pulse: 71 80  Resp: 18   Temp: 36.7 C   SpO2: 96%     Last Pain:  Vitals:   04/15/18 1056  TempSrc:   PainSc: 6                  Beryle Lathehomas E Zaela Graley

## 2018-04-17 NOTE — Progress Notes (Signed)
Called by central telemetry pts lead were off, upon entering  pts room noted pt with gown off, and taking leads off, stated she needed to get gown and IV off because she didn't feel good,  Assisted pt with gown and replaced leads, also trying to undo IV line, pt mildly confused stating she was going to convenience store and that she felt poorly, re-oriented pt, bed alarm set, son at bedside, called to room again by tech, pts son had undone IV line and told tech they were leaving and that we couldn't keep her here against her will, pt again stated she didn't feel well, explained to pt that she needed to be in hospital for treatment, she again stated she was leaving and that she felt poorly, assistant floor supervisor Robyn, explained that pt would not receive any further medications or treatments and insurance would possible refuse payment, pt and son verbalized understanding, refused to sign AMA papers, pt escorted out of facility by son.  Raymon MuttonGwen Less Woolsey RN

## 2019-07-03 DEATH — deceased

## 2019-11-21 IMAGING — CT CT ABD-PELV W/ CM
2 of 5 series · 16 of 46 positions shown, 18 images · IV contrast (iopamidol)
Comparison: No priors.

CLINICAL DATA: 57-year-old female with history of fatigue for the
past 3 days. Black tarry stools. Peripheral edema.

EXAM:
CT ABDOMEN AND PELVIS WITH CONTRAST
TECHNIQUE: Multidetector CT imaging of the abdomen and pelvis was performed
using the standard protocol following bolus administration of
intravenous contrast.
CONTRAST:  100mL YHIB2U-011 IOPAMIDOL (YHIB2U-011) INJECTION 61%

[Series 3: abd/ pelvis 5.0 i30f 2 · axial · 0.95mm/px · z∈[+904,+1304]mm · 13 of 90 slices shown, 15 images]
[im 5/90  soft-tissue]
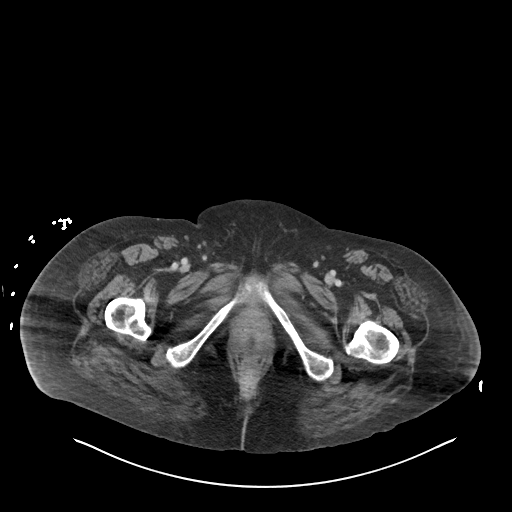
[im 5/90  bone]
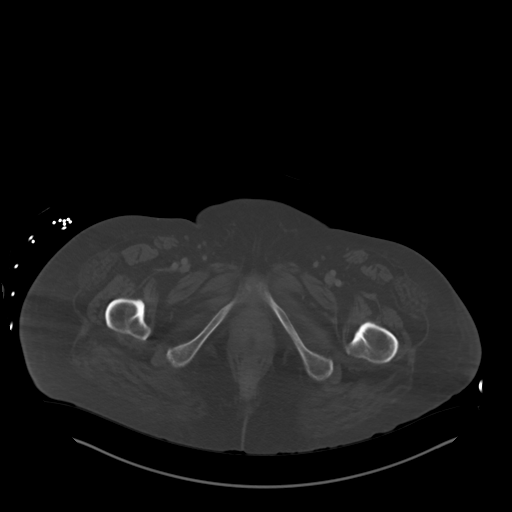
[im 15/90  soft-tissue]
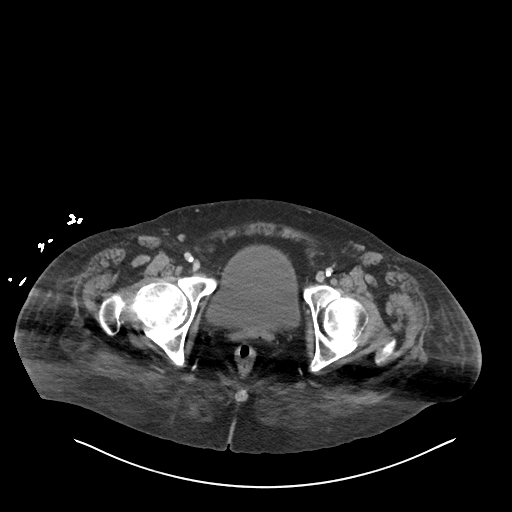
[im 19/90  soft-tissue]
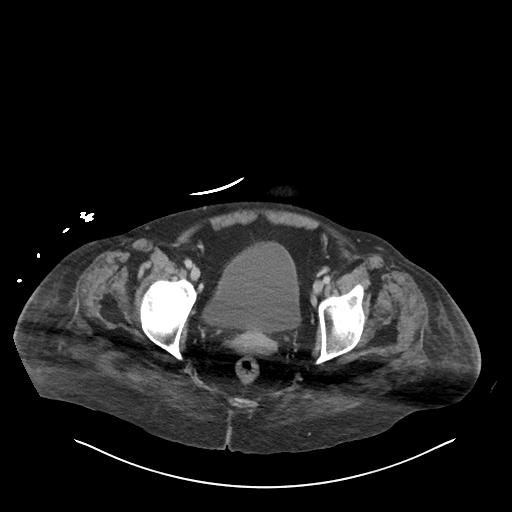
[im 24/90  soft-tissue]
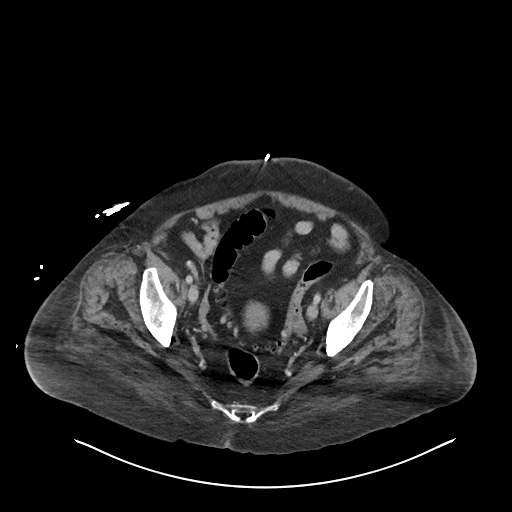
[im 33/90  soft-tissue]
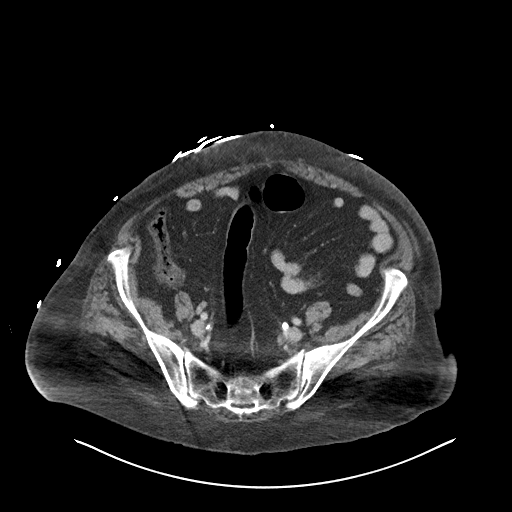
[im 38/90  soft-tissue]
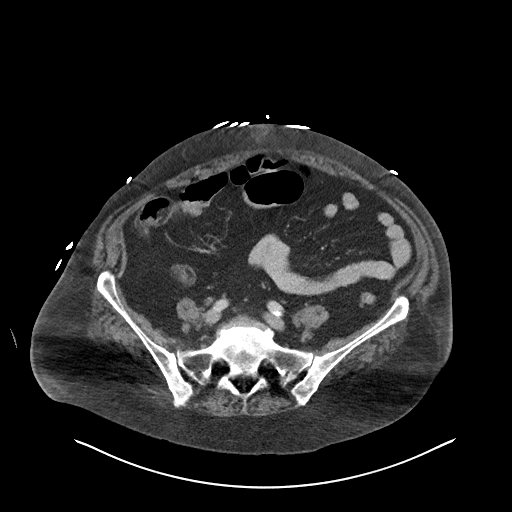
[im 47/90  soft-tissue]
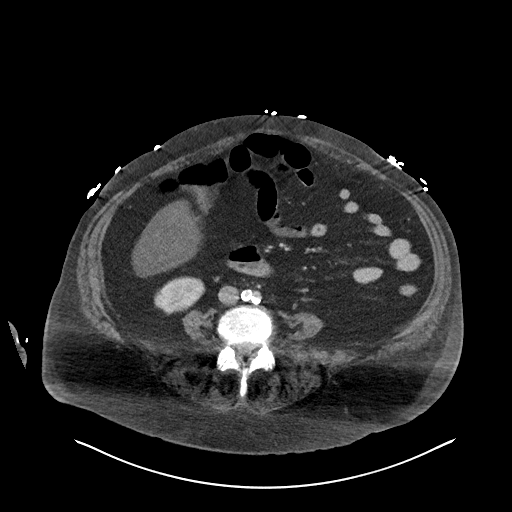
[im 52/90  soft-tissue]
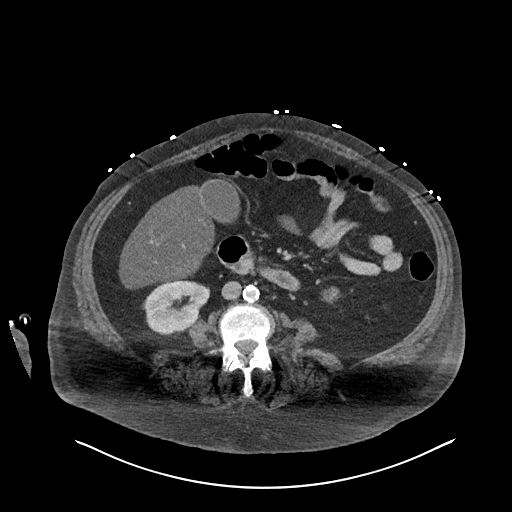
[im 57/90  soft-tissue]
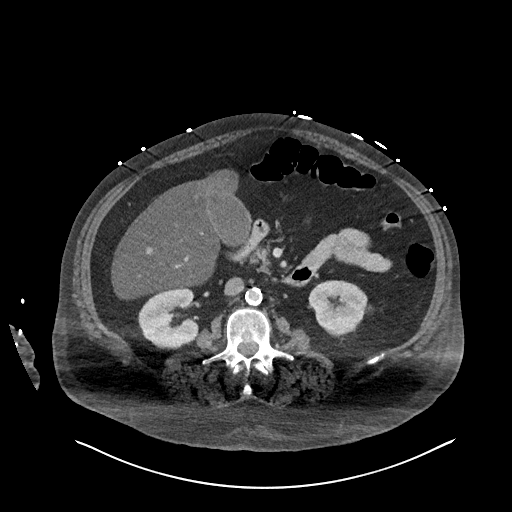
[im 57/90  bone]
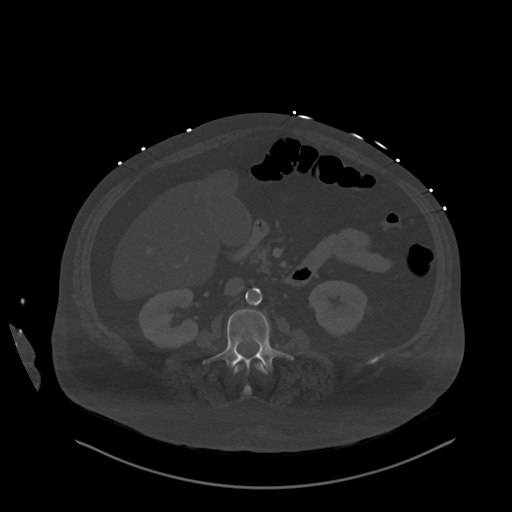
[im 66/90  soft-tissue]
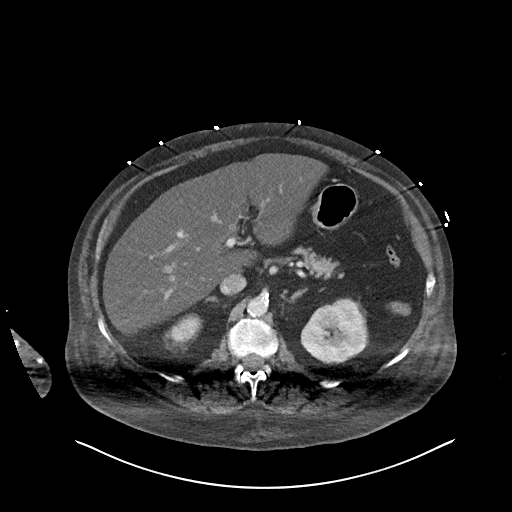
[im 71/90  soft-tissue]
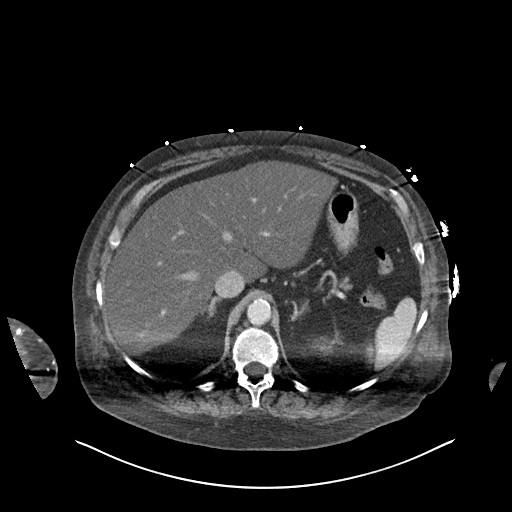
[im 75/90  soft-tissue]
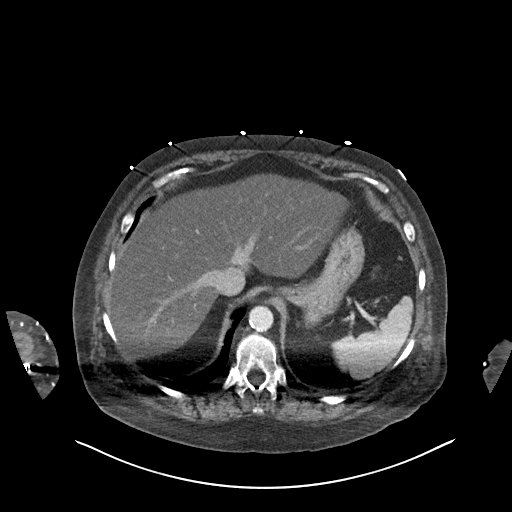
[im 85/90  soft-tissue]
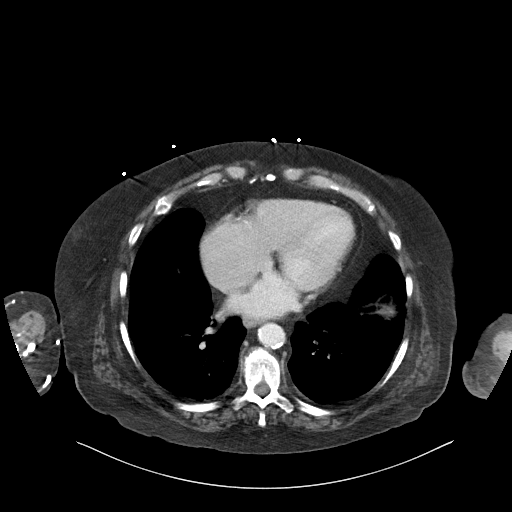

[Series 6: coronal soft tissue · coronal · 0.95mm/px · 3 of 124 slices shown]
[im 42/124  soft-tissue]
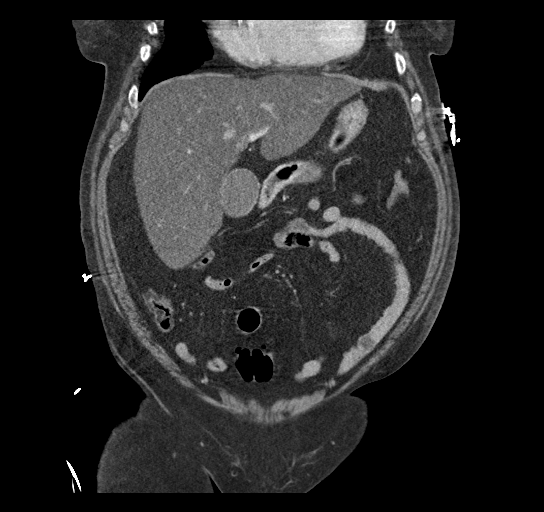
[im 55/124  soft-tissue]
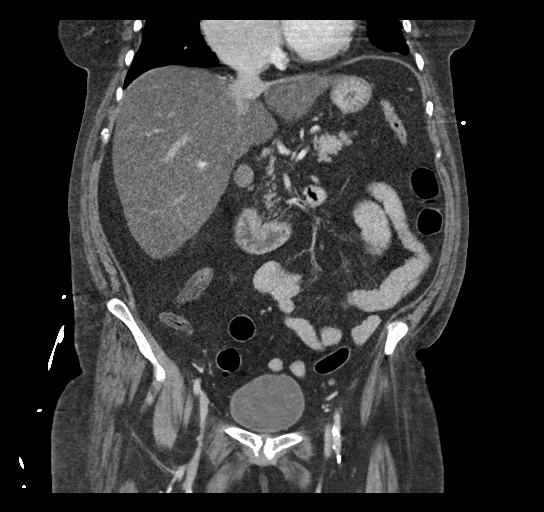
[im 69/124  soft-tissue]
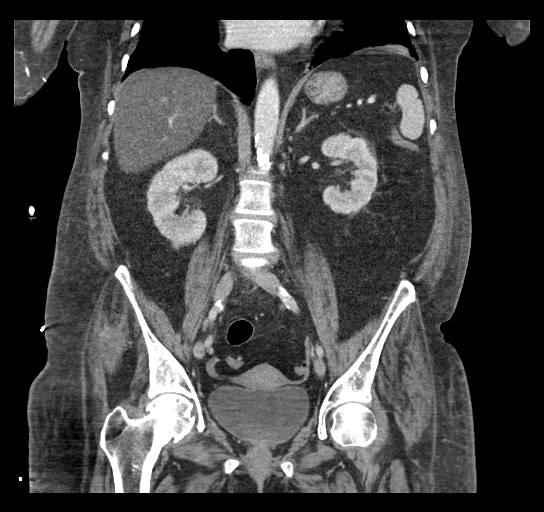

[16 of 46 positions shown; findings below may reference images not displayed]

FINDINGS: Lower chest: Cardiomegaly. Atherosclerotic calcifications in the
descending thoracic aorta, left circumflex and right coronary
arteries.

Hepatobiliary: Diffuse low attenuation throughout the hepatic
parenchyma, indicative of severe hepatic steatosis. No suspicious
cystic or solid hepatic lesions. No intra or extrahepatic biliary
ductal dilatation. Gallbladder is normal in appearance.

Pancreas: No pancreatic mass. No pancreatic ductal dilatation. No
pancreatic or peripancreatic fluid or inflammatory changes.

Spleen: Unremarkable.

Adrenals/Urinary Tract: Bilateral kidneys and bilateral adrenal
glands are normal in appearance. No hydroureteronephrosis. Urinary
bladder is normal in appearance.

Stomach/Bowel: Normal appearance of the stomach. No pathologic
dilatation of small bowel or colon. Numerous colonic diverticulae
are noted, without surrounding inflammatory changes to suggest an
acute diverticulitis at this time. Normal appendix.

Vascular/Lymphatic: Aortic atherosclerosis, without evidence of
aneurysm or dissection in the abdominal or pelvic vasculature. No
lymphadenopathy noted in the abdomen or pelvis.

Reproductive: Uterus and ovaries are unremarkable in appearance.

Other: No significant volume of ascites.  No pneumoperitoneum.

Musculoskeletal: There are no aggressive appearing lytic or blastic
lesions noted in the visualized portions of the skeleton.
IMPRESSION: 1. No acute findings are noted in the abdomen or pelvis to account
for the patient's symptoms.
2. Severe hepatic steatosis.
3. Colonic diverticulosis without evidence of acute diverticulitis
at this time.
4. Aortic atherosclerosis, in addition to least 2 vessel coronary
artery disease. Please note that although the presence of coronary
artery calcium documents the presence of coronary artery disease,
the severity of this disease and any potential stenosis cannot be
assessed on this non-gated CT examination. Assessment for potential
risk factor modification, dietary therapy or pharmacologic therapy
may be warranted, if clinically indicated.
5. Cardiomegaly.
